# Patient Record
Sex: Male | Born: 1960 | Race: White | Hispanic: No
Health system: Midwestern US, Community
[De-identification: ages and names within clinical notes are randomized; demographics above are authoritative.]

## PROBLEM LIST (undated history)

## (undated) DIAGNOSIS — I10 Essential (primary) hypertension: Secondary | ICD-10-CM

## (undated) DIAGNOSIS — N289 Disorder of kidney and ureter, unspecified: Secondary | ICD-10-CM

## (undated) DIAGNOSIS — N200 Calculus of kidney: Secondary | ICD-10-CM

## (undated) HISTORY — PX: NEPHRECTOMY RADICAL: SUR878

## (undated) HISTORY — PX: NEPHRECTOMY: SHX65

## (undated) HISTORY — PX: KIDNEY CYST REMOVAL: SHX684

---

## 2012-01-10 LAB — COMPREHENSIVE PANEL
ALT: 22 IU/L (ref 10–40)
AST: 17 IU/L (ref 8–41)
Albumin: 4.3 G/DL (ref 3.4–4.8)
Alkaline Phosphatase: 73 IU/L (ref 18–125)
Anion Gap: 11 MEQ/L (ref 7–19)
BUN: 14 MG/DL (ref 6–20)
CO2: 26 MEQ/L (ref 23–29)
Calcium: 9.1 MG/DL (ref 8.5–10.3)
Chloride: 102 MEQ/L (ref 98–111)
Creatinine: 1.1 MG/DL (ref 0.3–1.3)
Gfr Calculated: 76 mL/min (ref 59–300)
Glucose: 106 MG/DL
Potassium: 3.8 mEq/L (ref 3.5–5.0)
Sodium: 139 MEQ/L (ref 136–145)
Total Bilirubin: 0.5 MG/DL (ref 0.2–1.2)
Total Protein: 6.7 G/DL (ref 6.4–8.3)

## 2012-01-10 LAB — CBC PANEL ITEM
Basophils %: 0.4 % (ref 0–1)
Basophils: 0.03 10*3/uL (ref 0.0–0.20)
Eosinophils %: 0.38 10*3/uL (ref 0.0–0.60)
Eosinophils %: 4.6 % (ref 1–5)
Hematocrit: 44.3 % (ref 42–52)
Hemoglobin: 15.7 G/DL (ref 14.0–18.0)
Lymphocytes: 2.73 10*3/uL (ref 1.1–4.5)
Lymphocytes: 32.7 % (ref 20–40)
MCH: 31.7 PG — ABNORMAL HIGH (ref 27–31)
MCHC: 35.4 G/DL (ref 33–37)
MCV: 89.3 FL (ref 80–94)
MPV: 11.1 FL (ref 10.2–13.2)
Monocytes %: 6.9 % (ref 0–10)
Monocytes: 0.58 10*3/uL (ref 0.0–0.9)
Neutrophils %: 55.4 % (ref 50–70)
Neutrophils Absolute: 4.63 10*3/uL (ref 1.5–7.5)
Platelets: 153 10*3/uL (ref 130–400)
RBC: 4.96 MIL/uL (ref 4.7–6.1)
RDW: 13.9 % (ref 11.0–14.0)
WBC: 8.35 10*3/uL (ref 4.8–10.8)

## 2012-01-11 LAB — URINALYSIS
Bilirubin Urine: NEGATIVE
Glucose, Ur: NEGATIVE MG/DL
Ketones, Urine: NEGATIVE MG/DL
Leukocyte Esterase, Urine: NEGATIVE
Nitrite, Urine: NEGATIVE
Protein, UA: NEGATIVE MG/DL
Specific Gravity, Urine: 1.02 (ref 1.001–1.035)
Urobilinogen, Urine: 1 EU/DL
pH, UA: 5.5

## 2015-12-13 ENCOUNTER — Emergency Department: Admit: 2015-12-13 | Payer: Medicaid Other

## 2015-12-13 ENCOUNTER — Inpatient Hospital Stay: Admit: 2015-12-13 | Discharge: 2015-12-13 | Disposition: A | Discharge status: Home / Self Care | Payer: Medicaid Other

## 2015-12-13 DIAGNOSIS — N200 Calculus of kidney: Secondary | ICD-10-CM

## 2015-12-13 LAB — CBC WITH AUTO DIFFERENTIAL
Basophils %: 0.5 % (ref 0.0–1.0)
Basophils Absolute: 0 10*3/uL (ref 0.00–0.20)
Eosinophils %: 3.4 % (ref 0.0–5.0)
Eosinophils Absolute: 0.2 10*3/uL (ref 0.00–0.60)
Hematocrit: 44.6 % (ref 42.0–52.0)
Hemoglobin: 15.3 g/dL (ref 14.0–18.0)
Lymphocytes %: 37.1 % (ref 20.0–40.0)
Lymphocytes Absolute: 2.4 10*3/uL (ref 1.1–4.5)
MCH: 31.7 pg — ABNORMAL HIGH (ref 27.0–31.0)
MCHC: 34.3 g/dL (ref 33.0–37.0)
MCV: 92.3 fL (ref 80.0–94.0)
MPV: 10.9 fL (ref 9.4–12.4)
Monocytes %: 8 % (ref 0.0–10.0)
Monocytes Absolute: 0.5 10*3/uL (ref 0.00–0.90)
Neutrophils %: 50.8 % (ref 50.0–65.0)
Neutrophils Absolute: 3.3 10*3/uL (ref 1.5–7.5)
Platelets: 135 10*3/uL (ref 130–400)
RBC: 4.83 M/uL (ref 4.70–6.10)
RDW: 13.2 % (ref 11.5–14.5)
WBC: 6.4 10*3/uL (ref 4.8–10.8)

## 2015-12-13 LAB — COMPREHENSIVE METABOLIC PANEL
ALT: 19 U/L (ref 5–41)
AST: 19 U/L (ref 5–40)
Albumin: 4.4 g/dL (ref 3.5–5.2)
Alkaline Phosphatase: 63 U/L (ref 40–130)
Anion Gap: 11 mmol/L (ref 7–19)
BUN: 11 mg/dL (ref 6–20)
CO2: 24 mmol/L (ref 22–29)
Calcium: 9.1 mg/dL (ref 8.6–10.0)
Chloride: 104 mmol/L (ref 98–111)
Creatinine: 1 mg/dL (ref 0.5–1.2)
GFR Non-African American: >60 (ref >60)
Glucose: 80 mg/dL (ref 74–109)
Potassium: 4 mmol/L (ref 3.5–5.0)
Sodium: 139 mmol/L (ref 136–145)
Total Bilirubin: 0.8 mg/dL (ref 0.2–1.2)
Total Protein: 7.1 g/dL (ref 6.6–8.7)

## 2015-12-13 LAB — URINALYSIS
Bilirubin Urine: NEGATIVE
Blood, Urine: NEGATIVE
Glucose, Ur: NEGATIVE mg/dL
Ketones, Urine: NEGATIVE mg/dL
Leukocyte Esterase, Urine: NEGATIVE
Nitrite, Urine: NEGATIVE
Protein, UA: NEGATIVE mg/dL
Specific Gravity, UA: 1.02 (ref 1.005–1.030)
Urobilinogen, Urine: 1 E.U./dL (ref <2.0)
pH, UA: 6 (ref 5.0–8.0)

## 2015-12-13 MED ORDER — HYDROMORPHONE HCL 1 MG/ML IJ SOLN
1 MG/ML | Freq: Once | INTRAMUSCULAR | Status: AC
Start: 2015-12-13 — End: 2015-12-13
  Administered 2015-12-13: 16:00:00 1 mg via INTRAVENOUS

## 2015-12-13 MED ORDER — ONDANSETRON HCL 4 MG/2ML IJ SOLN
4 MG/2ML | Freq: Once | INTRAMUSCULAR | Status: AC
Start: 2015-12-13 — End: 2015-12-13
  Administered 2015-12-13: 15:00:00 4 mg via INTRAVENOUS

## 2015-12-13 MED ORDER — SODIUM CHLORIDE 0.9 % IV BOLUS
0.9 % | Freq: Once | INTRAVENOUS | Status: AC
Start: 2015-12-13 — End: 2015-12-13
  Administered 2015-12-13: 15:00:00 500 mL via INTRAVENOUS

## 2015-12-13 MED ORDER — TAMSULOSIN HCL 0.4 MG PO CAPS
0.4 MG | Freq: Once | ORAL | Status: AC
Start: 2015-12-13 — End: 2015-12-13
  Administered 2015-12-13: 15:00:00 0.4 mg via ORAL

## 2015-12-13 MED ORDER — PROMETHAZINE HCL 25 MG PO TABS
25 MG | ORAL_TABLET | Freq: Four times a day (QID) | ORAL | 0 refills | Status: AC | PRN
Start: 2015-12-13 — End: ?

## 2015-12-13 MED ORDER — MORPHINE SULFATE (PF) 4 MG/ML IV SOLN
4 MG/ML | Freq: Once | INTRAVENOUS | Status: AC
Start: 2015-12-13 — End: 2015-12-13
  Administered 2015-12-13: 15:00:00 4 mg via INTRAVENOUS

## 2015-12-13 MED ORDER — ONDANSETRON HCL 4 MG/2ML IJ SOLN
4 MG/2ML | Freq: Once | INTRAMUSCULAR | Status: AC
Start: 2015-12-13 — End: 2015-12-13
  Administered 2015-12-13: 16:00:00 4 mg via INTRAVENOUS

## 2015-12-13 MED FILL — HYDROMORPHONE HCL 1 MG/ML IJ SOLN: 1 MG/ML | INTRAMUSCULAR | Qty: 1

## 2015-12-13 MED FILL — TAMSULOSIN HCL 0.4 MG PO CAPS: 0.4 MG | ORAL | Qty: 1

## 2015-12-13 MED FILL — ONDANSETRON HCL 4 MG/2ML IJ SOLN: 4 MG/2ML | INTRAMUSCULAR | Qty: 2

## 2015-12-13 MED FILL — MORPHINE SULFATE (PF) 4 MG/ML IV SOLN: 4 MG/ML | INTRAVENOUS | Qty: 1

## 2015-12-13 NOTE — ED Provider Notes (Signed)
[facility] EMER[facility] DEPT  eMERGENCY dEPARTMENT eNCOUnter      Pt Name: Christopher Peters  MRN: [id number]  Birthdate Dec 20, 1960  Date of evaluation: 12/13/2015  Provider: Barbaraann Barthel, PA    CHIEF COMPLAINT       Chief Complaint   Patient presents with   . Flank Pain     left flank pain onset last pm hx kidney stones         HISTORY OF PRESENT ILLNESS  (Location/Symptom, Timing/Onset, Context/Setting, Quality, Duration, Modifying Factors, Severity.)   Christopher Peters is a 55 y.o. male who presents to the emergency department with left flank pain that began last night. States that he has a history of kidney stones in which he has had to have lithotripsy performed in the past due to inability to pass on his own. Has noted pain with urination since the symptoms began. Sees a urologist in [place] in which he is currently in town working over the next week. States that his pain is currently a 10/10 in which he has taken his prescribed pain medication without relief of his symptoms.     HPI    Nursing Notes were reviewed and I agree.    REVIEW OF SYSTEMS    (2-9 systems for level 4, 10 or more for level 5)     Review of Systems   Constitutional: Negative for chills and fever.   Respiratory: Negative for cough, shortness of breath and wheezing.    Cardiovascular: Negative for chest pain and leg swelling.   Gastrointestinal: Negative for abdominal pain, constipation, diarrhea, nausea and vomiting.   Genitourinary: Positive for difficulty urinating and flank pain. Negative for dysuria, frequency and hematuria.   Musculoskeletal: Negative for back pain, myalgias, neck pain and neck stiffness.   Neurological: Negative for dizziness, weakness, light-headedness, numbness and headaches.   All other systems reviewed and are negative.       Except as noted above the remainder of the review of systems was reviewed and negative.       PAST MEDICAL HISTORY     Past Medical History:   Diagnosis Date   . Hypertension    . Kidney  stone          SURGICAL HISTORY       Past Surgical History:   Procedure Laterality Date   . FRACTURE SURGERY           CURRENT MEDICATIONS       Discharge Medication List as of 12/13/2015 12:45 PM      CONTINUE these medications which have NOT CHANGED    Details   atenolol (TENORMIN) 50 MG tablet Take 50 mg by mouth dailyHistorical Med      Cholecalciferol (VITAMIN D3) 2000 units CAPS Take by mouthHistorical Med             ALLERGIES     Codeine    FAMILY HISTORY     History reviewed. No pertinent family history.       SOCIAL HISTORY       Social History     Social History   . Marital status: Legally Separated     Spouse name: N/A   . Number of children: N/A   . Years of education: N/A     Social History Main Topics   . Smoking status: Current Every Day Smoker   . Smokeless tobacco: None   . Alcohol use No   . Drug use: No   . Sexual activity:  Not Asked     Other Topics Concern   . None     Social History Narrative   . None       SCREENINGS    Glasgow Coma Scale  Eye Opening: Spontaneous  Best Verbal Response: Oriented  Best Motor Response: Obeys commands  Glasgow Coma Scale Score: 15      PHYSICAL EXAM    (up to 7 for level 4, 8 or more for level 5)   ED Triage Vitals   BP Temp Temp Source Pulse Resp SpO2 Height Weight   12/13/15 1157 12/13/15 1050 12/13/15 1157 12/13/15 1050 12/13/15 1050 12/13/15 1050 12/13/15 1050 12/13/15 1050   118/91 97.4 F (36.3 C) Temporal 59 20 97 %  (1.753 m) 260 lb (117.9 kg)       Physical Exam   Constitutional: He is oriented to person, place, and time. He appears well-developed and well-nourished.   Neck: Normal range of motion and full passive range of motion without pain. Neck supple.   Cardiovascular: Normal rate, regular rhythm, normal heart sounds and intact distal pulses.    Pulmonary/Chest: Effort normal and breath sounds normal. No respiratory distress.   Abdominal: Soft. Bowel sounds are normal. There is no tenderness.   Mild L flank TTP. No evidence of guarding  appreciated.    Musculoskeletal: Normal range of motion.   Neurological: He is alert and oriented to person, place, and time.   Skin: Skin is warm.   Nursing note and vitals reviewed.        DIAGNOSTIC RESULTS     RADIOLOGY:   Non-plain film images such as CT, Ultrasound and MRI are read by the radiologist. Plain radiographic images are visualized and preliminarily interpreted by No att. providers found with the below findings:    Interpretation per the Radiologist below, if available at the time of this note:    CT Kidney WO Contrast   Final Result   1. Multiple large stones in the left kidney without evidence of   hydronephrosis. 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    2. Tiny stones are present in the right kidney without evidence of   obstruction.   Signed by Dr Reuel Boom Riherd on 12/13/2015 11:57 AM          LABS:  Labs Reviewed   CBC WITH AUTO DIFFERENTIAL - Abnormal; Notable for the following:        Result Value    MCH 31.7 (*)     All other components within normal limits   COMPREHENSIVE METABOLIC PANEL   URINALYSIS       All other labs were within normal range or not returned as of this dictation.      EMERGENCY DEPARTMENT COURSE and DIFFERENTIAL DIAGNOSIS/MDM:   Vitals:    Vitals:    12/13/15 1050 12/13/15 1157 12/13/15 1315   BP:  (!) 118/91 122/77   Pulse: 59 58 52   Resp: Temp: 97.4 F (36.3 C) 97.1 F (36.2 C) 96.9 F (36.1 C)   TempSrc:  Temporal Oral   SpO2: 97% 98%    Weight: 260 lb (117.9 kg)     Height:  (1.753 m)           MDM  Number of Diagnoses or Management Options  Nephrolithiasis:   Diagnosis management comments: Advised to f/u with urology if symptoms return. States that his symptoms have improved while in the  ED. At this time, the pt is stable to be discharged home.       PROCEDURES:    Procedures      FINAL IMPRESSION      1. Nephrolithiasis          DISPOSITION/PLAN   DISPOSITION Decision to Discharge     Pt was told that if symptoms worsen or  new symptoms develop they're to return to their PCP or the emergency department.  Patient to follow up w/ their PCP. Patient  was educated on diagnosis and treatment plan.  All of patient's questions were answered, and the patient understands the discharge plan. I do not feel the patient has a life-threatening condition at this time.  Patient is stable to be discharged.      PATIENT REFERRED TO:  [facility]  [address]  [place] [address]  [phone]  Schedule an appointment as soon as possible for a visit  As needed    [facility]  EMERGENCY DEPT  [address]  [place] [address]  [phone]  Go to  If symptoms worsen      DISCHARGE MEDICATIONS:  Discharge Medication List as of 12/13/2015 12:45 PM      START taking these medications    Details   promethazine (PHENERGAN) 25 MG tablet Take 1 tablet by mouth every 6 hours as needed for Nausea, Disp-15 tablet, R-0Print             (Please note that portions of this note were completed with a voice recognition program.  Efforts were made to edit the dictations but occasionally words are mis-transcribed.)    Barbaraann Barthel, PA       [place], [place]  12/15/15 1742

## 2015-12-13 NOTE — Discharge Instructions (Signed)
 Kidney Stone: Care Instructions  Your Care Instructions    Kidney stones are formed when salts, minerals, and other substances normally found in the urine clump together. They can be as small as grains of sand or, rarely, as large as golf balls.  While the stone is traveling through the ureter, which is the tube that carries urine from the kidney to the bladder, you will probably feel pain. The pain may be mild or very severe. You may also have some blood in your urine. As soon as the stone reaches the bladder, any intense pain should go away.  If a stone is too large to pass on its own, you may need a medical procedure to help you pass the stone.  The doctor has checked you carefully, but problems can develop later. If you notice any problems or new symptoms, get medical treatment right away.  Follow-up care is a key part of your treatment and safety. Be sure to make and go to all appointments, and call your doctor if you are having problems. It's also a good idea to know your test results and keep a list of the medicines you take.  How can you care for yourself at home?   Drink plenty of fluids, enough so that your urine is light yellow or clear like water. If you have kidney, heart, or liver disease and have to limit fluids, talk with your doctor before you increase the amount of fluids you drink.   Take pain medicines exactly as directed. Call your doctor if you think you are having a problem with your medicine.   If the doctor gave you a prescription medicine for pain, take it as prescribed.   If you are not taking a prescription pain medicine, ask your doctor if you can take an over-the-counter medicine. Read and follow all instructions on the label.   Your doctor may ask you to strain your urine so that you can collect your kidney stone when it passes. You can use a kitchen strainer or a tea strainer to catch the stone. Store it in a plastic bag until you see your doctor again.  Preventing future  kidney stones  Some changes in your diet may help prevent kidney stones. Depending on the cause of your stones, your doctor may recommend that you:   Drink plenty of fluids, enough so that your urine is light yellow or clear like water. If you have kidney, heart, or liver disease and have to limit fluids, talk with your doctor before you increase the amount of fluids you drink.   Limit coffee, tea, and alcohol. Also avoid grapefruit juice.   Do not take more than the recommended daily dose of vitamins C and D.   Avoid antacids such as Gaviscon, Maalox, Mylanta, or Tums.   Limit the amount of salt (sodium) in your diet.   Eat a balanced diet that is not too high in protein.   Limit foods that are high in a substance called oxalate, which can cause kidney stones. These foods include dark green vegetables, rhubarb, chocolate, wheat bran, nuts, cranberries, and beans.  When should you call for help?  Call your doctor now or seek immediate medical care if:   You cannot keep down fluids.   Your pain gets worse.   You have a fever or chills.   You have new or worse pain in your back just below your rib cage (the flank area).   You have new or more  blood in your urine.  Watch closely for changes in your health, and be sure to contact your doctor if:   You do not get better as expected.  Where can you learn more?  Go to [email or web address] and sign in to your MyChart account. Enter [phone] in the Search Health Information box to learn more about "Kidney Stone: Care Instructions."     If you do not have an account, please click on the "Sign Up Now" link.  Current as of: April 11, 2014  Content Version: 11.2   2006-2017 [employer]. Care instructions adapted under license by [facility]. If you have questions about a medical condition or this instruction, always ask your healthcare professional. [employer] disclaims any warranty or liability for your use of this  information.

## 2015-12-21 NOTE — ED Provider Notes (Signed)
Formatting of this note is different from the original.  Subjective   HPI Comments: C/o left flank pain x 1 day.  Has a history of kidney stones and renal failure.  He states he has 17% function of the left kidney.  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  He is here trying to find work.    Patient is a 55 y.o. male presenting with flank pain.   History provided by:  Patient  Language interpreter used: No    Flank Pain   Pain location:  L flank  Pain quality: pressure, sharp and stabbing    Pain quality: not aching, not bloating, not burning, not cramping, not dull, no fullness, not gnawing, not heavy, not shooting, not squeezing, no stiffness, not tearing, not throbbing and not tugging    Pain radiates to:  Does not radiate  Pain severity:  Moderate  Onset quality:  Sudden  Duration:  1 day  Timing:  Intermittent  Progression:  Worsening  Chronicity:  Recurrent  Context: not alcohol use, not awakening from sleep, not diet changes, not eating, not laxative use, not medication withdrawal, not previous surgeries, not recent illness, not recent sexual activity, not recent travel, not retching, not sick contacts, not suspicious food intake and not trauma    Relieved by:  None tried  Worsened by:  Nothing  Ineffective treatments:  None tried  Associated symptoms: no anorexia, no belching, no chest pain, no chills, no constipation, no cough, no diarrhea, no dysuria, no fatigue, no fever, no flatus, no hematemesis, no hematochezia, no hematuria, no melena, no nausea, no shortness of breath, no sore throat, no vaginal bleeding, no vaginal discharge and no vomiting      Review of Systems   Constitutional: Negative for activity change, appetite change, chills, fatigue and fever.   HENT: Negative for congestion, ear pain, facial swelling and sore throat.    Eyes: Negative for discharge and visual disturbance.   Respiratory: Negative for apnea, cough, chest tightness, shortness of breath, wheezing and stridor.     Cardiovascular: Negative for chest pain and palpitations.   Gastrointestinal: Negative for abdominal distention, abdominal pain, anorexia, constipation, diarrhea, flatus, hematemesis, hematochezia, melena, nausea and vomiting.   Genitourinary: Positive for flank pain. Negative for difficulty urinating, dysuria, hematuria, vaginal bleeding and vaginal discharge.   Musculoskeletal: Negative for arthralgias and myalgias.   Skin: Negative for rash and wound.   Neurological: Negative for dizziness and seizures.   Psychiatric/Behavioral: Negative for agitation and confusion.     Past Medical History:   Diagnosis Date   ? Hypertension    ? Kidney failure      Allergies   Allergen Reactions   ? Tylenol With Codeine #3 [Acetaminophen-Codeine]      History reviewed. No pertinent surgical history.    History reviewed. No pertinent family history.    Social History     Social History   ? Marital status: Married     Spouse name: N/A   ? Number of children: N/A   ? Years of education: N/A     Social History Main Topics   ? Smoking status: Current Every Day Smoker     Packs/day: 1.00   ? Smokeless tobacco: None   ? Alcohol use No   ? Drug use: No   ? Sexual activity: Not Asked     Other Topics Concern   ? None     Social History Narrative   ? None  Objective   Physical Exam   Constitutional: He is oriented to person, place, and time. He appears well-developed.   HENT:   Head: Normocephalic.   Eyes: EOM are normal. Pupils are equal, round, and reactive to light.   Neck: Normal range of motion. Neck supple.   Cardiovascular: Normal rate and regular rhythm.    No murmur heard.  Pulmonary/Chest: Effort normal and breath sounds normal.   Abdominal: Soft. Bowel sounds are normal.   Musculoskeletal: Normal range of motion.   Neurological: He is alert and oriented to person, place, and time.   Skin: Skin is warm and dry.   Psychiatric: He has a normal mood and affect.   Nursing note and vitals reviewed.    Procedures        ED  Course  ED Course   Comment By Time   Transfer of care to [facility], APRN 07/31 2055       I have reassessed the patient.  He reports feeling better after the pain medication. He has been reexamined. He has pain to palpation on his lower spinous process and left lower paralumbar region.   He does not present with the rash. I have updated the patient on all test results. He is visiting for work and plans to go home in 2-3 weeks. I have reviewed the differentials, I.e. Back pain/strain, herpes zoster, flank pain, etc...    Patient has been advised to followup with his PCP. He is welcome to return to the ED for any acute worsening s/sx.         MDM    Final diagnoses:   Left-sided back pain, unspecified back location, unspecified chronicity       [address] [place], [place]  12/21/15 2132    Electronically signed by Drue Stager, MD at 12/21/2015 11:07 PM EDT    Associated attestation - Drue Stager, MD - 12/21/2015 11:07 PM EDT  Formatting of this note might be different from the original.      For this patient encounter, I reviewed the NP or PA documentation, treatment plan, and medical decision making. Drue Stager, MD 12/21/2015 10:07 PM

## 2015-12-21 NOTE — ED Notes (Signed)
Formatting of this note might be different from the original.  Patient discharged home  with family at side ambulatory to personal car. No distress noted. Personal belongings with patient. Patient/family voice understanding to instructions given. Pt denies any pain.      Heidi Dach, RN  12/21/15 2144    Electronically signed by Heidi Dach, RN at 12/21/2015 10:44 PM EDT

## 2017-01-18 NOTE — ED Notes (Signed)
Formatting of this note might be different from the original.  LWOT @1942PM     Ulla PotashHeather N Johnson, RN  01/18/17 2028    Electronically signed by Ulla PotashHeather N Johnson, RN at 01/18/2017  8:28 PM EDT

## 2017-01-18 NOTE — ED Triage Notes (Signed)
Formatting of this note might be different from the original.  Pt arrives with c/o left flank pain that radiates to left abdomen. Pt reports he had his left kidney removed in April due to excessive kidney stones. Pt reports he has been having problems since the surgery. Pt is from [place] on vacation. Urologist is in [place].   Electronically signed by Wendie SimmerElizabeth M Townsend, RN at 01/18/2017  6:27 PM EDT

## 2017-06-15 NOTE — ED Provider Notes (Signed)
Formatting of this note is different from the original.  Images from the original note were not included.    History     Chief Complaint   Patient presents with   ? Shortness of Breath     Pt c/o worsening shortness of breath with substernal chest pressure x 4-5 days.  Pt reports being seen and dx with bronchitis, taking abx as prescribed "and mucinex and cough pearls".  Pt denies any dizziness, c/o some intermittent nausea.      History provided by:  Patient  Language interpreter used: No    Chest Pain   Pain location:  Substernal area  Pain quality: pressure    Pain radiates to:  Does not radiate  Pain severity:  Moderate  Onset quality:  Gradual  Duration:  1 day  Timing:  Constant  Progression:  Unchanged  Chronicity:  New  Context: at rest    Context: not breathing, not drug use, not eating, not intercourse, not lifting, not movement, not raising an arm, not stress and not trauma    Relieved by:  Nothing  Worsened by:  Deep breathing, coughing and exertion  Ineffective treatments:  Rest (inhaler, tessalon)  Associated symptoms: cough, fatigue, nausea and shortness of breath    Associated symptoms: no abdominal pain, no AICD problem, no altered mental status, no anorexia, no anxiety, no back pain, no claudication, no diaphoresis, no dizziness, no dysphagia, no fever, no headache, no heartburn, no lower extremity edema, no near-syncope, no numbness, no orthopnea, no palpitations, no PND, no syncope, no vomiting and no weakness    Risk factors: hypertension, male sex, obesity and smoking    Risk factors: no aortic disease, no birth control, no coronary artery disease, no diabetes mellitus, no high cholesterol, no immobilization, no prior DVT/PE and no surgery    URI   Presenting symptoms: congestion, cough, fatigue and rhinorrhea    Presenting symptoms: no ear pain, no facial pain, no fever and no sore throat    Severity:  Moderate  Onset quality:  Gradual  Duration:  4 days  Timing:  Constant  Progression:   Worsening  Chronicity:  New  Relieved by:  Nothing  Worsened by:  Nothing  Ineffective treatments:  Inhaler and prescription medications  Associated symptoms: no arthralgias, no headaches, no myalgias, no neck pain, no sinus pain, no sneezing, no swollen glands and no wheezing    Risk factors: not elderly, no chronic cardiac disease, no chronic kidney disease, no chronic respiratory disease, no diabetes mellitus, no immunosuppression, no recent illness, no recent travel and no sick contacts      No LMP for male patient.            Allergies   Allergen Reactions   ? Tylenol-Codeine [Acetaminophen-Codeine]      "made my skin crawl"     Past Medical History:   Diagnosis Date   ? Anxiety    ? Hypertension    ? Neuromyopathy (HCC)    ? Obesity    ? Renal disorder      Past Surgical History:   Procedure Laterality Date   ? HEART CATHETERIZATION     ? NEPHRECTOMY      LEFT     No family history on file.    Social History     Tobacco Use   ? Smoking status: Current Every Day Smoker     Packs/day: 1.00     Types: Cigarettes   ? Smokeless tobacco: Never Used  Substance Use Topics   ? Alcohol use: No   ? Drug use: No     Review of Systems   Constitutional: Positive for appetite change and fatigue. Negative for chills, diaphoresis, fever and unexpected weight change.   HENT: Positive for congestion, postnasal drip and rhinorrhea. Negative for ear discharge, ear pain, sinus pressure, sinus pain, sneezing, sore throat, tinnitus and trouble swallowing.    Eyes: Negative for pain, discharge, redness and visual disturbance.   Respiratory: Positive for cough, chest tightness and shortness of breath. Negative for choking and wheezing.    Cardiovascular: Positive for chest pain. Negative for palpitations, orthopnea, claudication, leg swelling, syncope, PND and near-syncope.   Gastrointestinal: Positive for nausea. Negative for abdominal distention, abdominal pain, anorexia, constipation, diarrhea, heartburn and vomiting.    Genitourinary: Negative for decreased urine volume, difficulty urinating, dysuria, flank pain, frequency and urgency.   Musculoskeletal: Negative for arthralgias, back pain, myalgias and neck pain.   Skin: Negative for pallor and rash.   Allergic/Immunologic: Negative for immunocompromised state.   Neurological: Negative for dizziness, syncope, weakness, light-headedness, numbness and headaches.   Hematological: Negative for adenopathy.   Psychiatric/Behavioral: Negative for confusion.   All other systems reviewed and are negative.        Physical Exam     Vitals Recorded in This Encounter       06/15/2017 1216 06/15/2017 1633        BP:  147/105  (Abnormal)   156/96  (Abnormal)       Pulse:  64  52      Resp:  18  --      Temp:  36.4 C (97.5 F)  --      Temp src:  Oral  --      SpO2:  98 %  95 %      Weight:  128.8 kg (284 lb)  (Abnormal)   --      Height:  1.753 m (5\' 9" )  --         Physical Exam   Constitutional: He is oriented to person, place, and time. He appears well-developed and well-nourished. No distress.   HENT:   Head: Normocephalic and atraumatic.   Right Ear: Tympanic membrane normal.   Left Ear: Tympanic membrane normal.   Nose: Mucosal edema and rhinorrhea present. Right sinus exhibits no maxillary sinus tenderness and no frontal sinus tenderness. Left sinus exhibits no maxillary sinus tenderness and no frontal sinus tenderness.   Mouth/Throat: Uvula is midline and mucous membranes are normal. Posterior oropharyngeal erythema present. No oropharyngeal exudate or posterior oropharyngeal edema. No tonsillar exudate.   Eyes: Conjunctivae and EOM are normal. Pupils are equal, round, and reactive to light. Right eye exhibits no discharge. Left eye exhibits no discharge. No scleral icterus.   Neck: Normal range of motion. Neck supple. No JVD present.   Cardiovascular: Normal rate, regular rhythm, normal heart sounds and intact distal pulses. Exam reveals no gallop and no friction rub.   No murmur  heard.  Pulmonary/Chest: Effort normal and breath sounds normal. No respiratory distress. He has no wheezes. He has no rales.   Abdominal: Soft. Bowel sounds are normal. He exhibits no distension and no mass. There is no tenderness. There is no rebound and no guarding.   Musculoskeletal: Normal range of motion. He exhibits no edema.   Lymphadenopathy:     He has no cervical adenopathy.   Neurological: He is alert and oriented to person, place, and time. He  has normal strength.   Skin: Skin is warm and dry. Capillary refill takes less than 2 seconds. No rash noted. He is not diaphoretic. No erythema. No pallor.   Psychiatric: He has a normal mood and affect. His behavior is normal. Thought content normal.   Nursing note and vitals reviewed.    ED Course       Results for orders placed or performed during the hospital encounter of 06/15/17   CBC and differential   Result Value Ref Range    WBC 6.8 3.7 - 10.3 X THOU    RBC 4.77 3.98 - 5.84 X MIL    Hemoglobin 15.2 13.3 - 17.1 g/dL    Hematocrit 45 38 - 49 %    MCV 94 81 - 98 fl    MCH 31.9 27.4 - 33.7 pG    MCHC 33.8 32.8 - 35.8 g/dL    Platelet Count 161 (L) 144 - 402 x THOU    RDW 13.1 12.0 - 16.3    MPV 9.6 7.5 - 11.2 fL    Platelets 111 (L) 144 - 402 x THOU    Segs 66 44 - 77 %    Bands Relative NONE 0 - 10 %    Lymphs 24 14 - 56 %    Monocytes 6 4 - 14 %    Eosinophil 3 0 - 10 %    Basophils % 1 0 - 3 %    Metamyelocytes Relative NONE 0 %    Myelocyte NONE 0 %    Promyelocyte NONE 0 %    Blasts NONE 0 %    Neutrophils Absolute 4.5 1.5 - 7.7 x THOU/uL    Lymphocytes Absolute 1.7 0.9 - 3.5 x Thou/uL    Monocytes Absolute 0.4 0.3 - 1.0 x Thou/uL    Eosinophils Absolute 0.2 0.0 - 0.7 x Thou/uL    Basophils Absolute 0.1 0.0 - 0.2 x Thou/uL    Nucleated RBC's NONE 0 /100 WBC    Platelet Estimate NOT PERFORMED /OIF    Morphology Comments NOT PERFORMED    Comprehensive metabolic panel   Result Value Ref Range    Sodium, Serum 139 136 - 145 mmol/L    Potassium 3.8 3.5 -  5.1 mmol/L    Chloride 104 98 - 107 mmol/L    CO2 28 21 - 32 mmol/L    Glucose 97 74 - 106 mg/dL    BUN, Serum 16 7 - 18 mg/dL    Creatinine, Ser 1.4 (H) 0.6 - 1.3 mg/dL    Total Protein 6.7 6.4 - 8.3 g/dL    Albumin 3.8 3.4 - 5.0 g/dL    Calcium 8.2 (L) 8.5 - 10.1 mg/dL    AST 30 7 - 35 U/L    Alkaline Phosphatase 71 45 - 117 U/L    Total Bilirubin 1.2 (H) 0.2 - 1.0 mg/dL    ALT 30 12 - 78 U/L    A/G Ratio 1.3 1.0 - 2.2 RATIO    Osmolality Calc 279 275 - 300 mOsmol/kg    Anion Gap 7 5.0 - 14.0 mmol/L    GFR MDRD Non Af Amer 56 (L) >59 mL/min/1.4m    GFR MDRD Af Amer >60 >59 mL/min/1.30m    GFR Comments SEE NOTES    Troponin I   Result Value Ref Range    Troponin I <0.015 <0.045 ng/mL   NT-ProBNP   Result Value Ref Range    NT-ProBNP (B-Type Natriuretic Peptide) 270 <300  pg/mL   Urinalysis with reflex microscopy, reflex culture and culture screen   Result Value Ref Range    Appearance CLEAR     Color, UA YELLOW     Specific Gravity, UA 1.020 1.001 - 1.035    pH, UA 6.0 5 - 8    Protein, UA 30 (A) N^NEGATIVE mg/dL    Glucose, UA NEGATIVE N^NEGATIVE mg/dL    Ketones, UA TRACE (A) N^NEGATIVE MG/DL    Bilirubin, UA NEGATIVE N^NEGATIVE    Blood, UA NEGATIVE N^NEGATIVE    Nitrite, UA NEGATIVE N^NEGATIVE    Urobilinogen, UA 4.0 (H) 0.2 - 1.0 EU/DL    Leukocytes, UA NEGATIVE N^NEGATIVE    Urine Culture screen URINE CULTURE SCREEN NEGATIVE.    Urine Microscopy   Result Value Ref Range    WBC, UA 1 TO 5 0 - 5 /HPF    RBC, UA NONE 0 - 2 /HPF    Bacteria, Urine 4+ (A) NONE^NONE /HPF    Epithelial Cells 1+ (A) OCC^OCC /LPF    Epithelial Cells SQUAMOUS (A) OCC^OCC /LPF    Urine Micro Comments SEE NOTES NONE^NONE /HPF   Troponin I   Result Value Ref Range    Troponin I <0.015 <0.045 ng/mL             Imaging Results       XR PORTABLE CHEST AP (Final result)  Result time 06/15/17 12:55:59    Final result by Lovett Sox. Aundria Rud, MD (06/15/17 12:55:59)            Impression:    XR CHEST AP PORTABLE  ORDERING PRIORITY   :STAT  DATE/TIME  PERFORMED :06/15/2017 12:37  DICTATING RADIOLOGIST:ROGERS, STEVEN  CLINICAL INDICATIONS:chest pain  ORDERING PHYSICIAN  :Starr Sinclair    Study:  XR CHEST AP PORTABLE    Reason for Exam:  chest pain    Comparison:  None    Findings:  Cardiac monitor leads seen overlying the chest. Heart size is normal. Mild chronic increased bronchial interstitial markings without localized infiltrate. No pleural fluid or pneumothorax seen. Osseous structures are intact.    Impression:  Chronic findings as above no acute findings in the chest.    Electronically signed by: Clayborne Artist, M.D. 06/15/2017 12:55 PM                   EKG:    Vent. Rate : 064 BPM   Atrial Rate : 064 BPM   P-R Int : 168 ms     QRS Dur : 104 ms   QT Int : 400 ms    P-R-T Axes : 068 -04 040 degrees   QTc Int : 412 ms    Normal sinus rhythm  Incomplete right bundle branch block  Nonspecific T wave abnormality    Procedures    MDM  Number of Diagnoses or Management Options  Acute viral bronchitis: new and requires workup  Diagnosis management comments: Patient evaluated for complaint of substernal chest pressure and tightness that began last night along with worsening shortness of breath at rest. Patient has been having URI symptoms, was seen at another facility, diagnosed with bronchitis and was prescribed albuterol inhaler and tessalon. He has been taking Mucinex as well. He got seen at a clinic and was given doxycycline. Patient states cough was productive but is not dry and he has SOB at rest and substernal chest pressure. He coughs if he tries to take a deep breath but denies pain with inspiration.  He reports nausea, denies diarrhea or constipation. He is afebrile, is not tachycardic, appears uncomfortable but not toxic. Physical exam mostly unremarkable. Symptoms and presentation likely due continuing viral bronchitis but patient is a 57 year old male, hx HTN, 1 ppd smoker x 40 years. Will r/o cardiac cause. EKG normal sinus rhythm,  incomplete RBBB, nonspecific T wave abnormality. No ST segment abnormality. Chest x-ray negative for effusion or infiltrate, +increased bronchial interstitial markings. BNP negative, troponin negative x 2. Patient treated with IVF and dexamethasone. Prescribed antihistamine, intranasal steroid, refilled tessalon. Advised to continue inhaler, rest, oral hydration. ED return precautions given, otherwise referred to PCP.    Patient Progress  Patient progress: stable    Patient evaluated in ED and specified complaints managed; any additional findings addressed accordingly in the appropriate context of such discovery throughout ED course.     Medications given in ED as applicable:    Medications   sodium chloride 0.9 % bolus 1,000 mL (0 mLs Intravenous Infusion Complete 06/15/17 1559)   dexamethasone (DECADRON) injection 10 mg (10 mg Intravenous Given 06/15/17 1326)     Patient Vitals for the past 24 hrs:   BP Temp Temp src Pulse Resp SpO2 Height Weight   06/15/17 1633 (!) 156/96 -- -- 52 -- 95 % -- --   06/15/17 1216 (!) 147/105 36.4 C (97.5 F) Oral 64 18 98 % 1.753 m (5\' 9" ) (!) 128.8 kg (284 lb)     All pertinent history, physical exam findings, and evaluation/treatment modalities/results reviewed urgently as they became available and were addressed during hospital course as those that were available at time of disposition.     Discussed results with patient and answered questions as posed as best as possible.  Patient verbalizes understanding. Patient recommended for further workup, evaluation, and treatment as deemed appropriate.      Patient improved. No complaints at time of discharge. Answered all questions asked by patient and family as posed. Fair comprehension. Agreed on discharge, return to ED as needed, and follow up as directed.    Discharge Medication List as of 06/15/2017  4:36 PM     START taking these medications    Details   benzonatate (TESSALON) 100 MG capsule Take 1 capsule (100 mg total) by mouth 3  (three) times daily as needed for Cough, Starting Thu 06/15/2017, Print     fexofenadine (ALLEGRA) 60 MG tablet Take 1 tablet (60 mg total) by mouth 2 (two) times daily, Starting Thu 06/15/2017, Until Fri 06/15/2018, Print     fluticasone (FLONASE) 50 mcg/actuation nasal spray Place 1 spray into each nostril daily Use daily for at least 2 weeks, Starting Thu 06/15/2017, Until Fri 06/15/2018, Print         Follow up:    Haywood Filler, MD  [address]  [place] [address]  [phone]    Call in 2 days    This encounter was dictated with voice recognition software and may contain inadvertent recognition errors.    ED Diagnosis     Final diagnoses:   Acute viral bronchitis       Starr Sinclair, NP  06/15/17 1700      Mariea Stable, MD  06/22/17 2042    Electronically signed by Mariea Stable, MD at 06/22/2017  8:42 PM EST    Associated attestation - Mariea Stable, MD - 06/22/2017  8:42 PM EST  Formatting of this note might be different from the original.  Patient was  seen  by a Physician Assistant/Nurse Practitioner and not personally by me. I was available for consultation while the patient was in the emergency department. I have reviewed and am in agreement with the documented history, exam, workup interpretation, diagnosis, disposition, and treatment plan.

## 2017-06-15 NOTE — ED Notes (Signed)
Formatting of this note might be different from the original.  Pt updated on status, to have repeat troponin drawn.  Rationale for repeat lab provided, pt denied any questions or concerns.   Electronically signed by Lynett Fish, RN at 06/15/2017  4:45 PM EST

## 2017-06-15 NOTE — ED Notes (Signed)
Formatting of this note might be different from the original.  Pt in bed, resting quietly.  Side rails up, call light within reach, and visitor at bedside.  Pt updated on status, denied any questions or needs at this time.   Electronically signed by Lynett Fish, RN at 06/15/2017  4:46 PM EST

## 2017-06-15 NOTE — ED Notes (Signed)
Formatting of this note might be different from the original.  Pt given urine cup and encouraged to void as soon as possible.   Electronically signed by Lynett Fish, RN at 06/15/2017 12:28 PM EST

## 2017-06-15 NOTE — ED Notes (Signed)
Formatting of this note might be different from the original.  Pt in no apparent distress at discharge.  Discharge, prescription, and follow up information provided, pt denied any questions or concerns.  Pt ambulated from room with steady gait. Family to transport patient home.      Electronically signed by Lynett Fish, RN at 06/15/2017  4:44 PM EST

## 2017-06-22 NOTE — ED Notes (Signed)
Formatting of this note might be different from the original.  Discharge instructions, prescriptions, and follow up instructions discussed with patient. Verbalizes understanding of all information and denies questions/concerns. Patient ambulatory with steady gait to lobby upon discharge.  Electronically signed by Myles Rosenthal, RN MSN at 06/22/2017  7:04 PM EST

## 2017-06-22 NOTE — ED Notes (Signed)
Formatting of this note might be different from the original.  Patient with SOB and cough for 2 weeks. States this is his 3rd ER visit. Diagnosed by another facility with bronchitis and given steroid which "really seemed to help" and antibiotics which he states he finished 3 days ago. Also given inhaler (maybe albuterol?) which he has been using. States SOB has become increasingly worse in the past few days. Patient with inspiratory and expiratory wheezes in the bilateral lower lobes. Strong cough noted with deep inspiration. Patient appears dyspneic at rest and on exertion. Difficulty speaking in complete sentences.  Electronically signed by Myles Rosenthal, RN MSN at 06/22/2017  6:34 PM EST

## 2017-06-22 NOTE — ED Provider Notes (Signed)
Formatting of this note is different from the original.  Provider Triage Note    HPI: Christopher Peters is a 57 y.o. male who presents with worsening productive cough with yellowish sputum, shortness of breath, chest wall pain for the last 2 weeks.  Patient states he was diagnosed with bronchitis about 2 weeks ago and prescribed doxycycline.  Patient states he completed a course of antibiotics, using Albuterol at home without any improvement.  He states that he was given Decadron injection while in ED on 06/15/17 which did help his symptoms intermittently; however, symptoms have returned. Denies any fever, chills, hemoptysis.     REVIEW OF SYSTEMS    All other systems negative except as reported in the history of present illness.    PHYSICAL EXAM    General:  No acute distress.   Heart:  RRR  Pulmonary:  No acute respiratory distress  Triage Recommendation  Chart signed by Marisue Humble, PA-C  Patient sent to main ED/FT for further exam and final disposition    eMERGENCY dEPARTMENT eNCOUnter    [facility]  [place], [place] [address]    History   CHIEF COMPLAINT  Chief Complaint   Patient presents with   ? Cough     HPI   Christopher Peters is a 57 y.o. male who presents planing of worsening productive cough, chest pain that worsens with deep breath and coughing and shortness of breath for 2 weeks.  States she was diagnosed with bronchitis 2 weeks ago and was prescribed doxycycline without any relief.  Denies of any fever, chills, dizziness, sore throat, body ache, leg swelling, palpitation, night sweats or hemoptysis    REVIEW OF SYSTEMS    Review of Systems   Constitutional: Negative for chills, fever and malaise/fatigue.   HENT: Negative.  Negative for ear pain and sore throat.    Eyes: Negative.    Respiratory: Positive for cough, sputum production and shortness of breath. Negative for hemoptysis.    Cardiovascular: Negative for chest pain, palpitations and leg swelling.   Gastrointestinal:  Negative for abdominal pain and vomiting.   Genitourinary: Negative.    Musculoskeletal: Negative.    Skin: Negative.    Neurological: Negative.  Negative for dizziness.   Psychiatric/Behavioral: Negative.      All other systems negative except as reported in the history of present illness.    PAST MEDICAL HISTORY    Past Medical History:   Diagnosis Date   ? Hypertension    ? Kidney stones    ? Renal disorder      SURGICAL HISTORY    Past Surgical History:   Procedure Laterality Date   ? FRACTURE SURGERY     ? NEPHRECTOMY Left      CURRENT MEDICATIONS      Current Facility-Administered Medications:   ?  ipratropium-albuterol (DUO-NEB) 0.5-2.5 mg/3 mL nebulizer solution, , , ,     Current Outpatient Prescriptions:   ?  albuterol (PROVENTIL HFA;VENTOLIN HFA) 90 mcg/actuation inhaler, Inhale 1-2 puffs every six (6) hours as needed., Disp: 1 Inhaler, Rfl: 0  ?  atenolol (TENORMIN) 50 MG tablet, Take 50 mg by mouth daily., Disp: , Rfl: 0  ?  benzonatate (TESSALON) 100 MG capsule, Take 1 capsule (100 mg total) by mouth Three (3) times a day as needed for cough. for up to 3 days, Disp: 12 capsule, Rfl: 0  ?  bumetanide (BUMEX) 0.5 MG tablet, Take 0.5 mg by mouth daily., Disp: , Rfl:   ?  ergocalciferol (VITAMIN D2) 50,000 unit capsule, Take 50,000 Units by mouth once a week., Disp: , Rfl:   ?  fluticasone (FLONASE) 50 mcg/actuation nasal spray, 1 spray by Each Nare route daily., Disp: , Rfl:   ?  gabapentin (NEURONTIN) 300 MG capsule, Take 300 mg by mouth Three (3) times a day., Disp: , Rfl: 2  ?  GENTLE LAXATIVE 5 mg EC tablet, Take 5 mg by mouth daily., Disp: , Rfl: 11  ?  levoFLOXacin (LEVAQUIN) 750 MG tablet, Take 1 tablet (750 mg total) by mouth daily. for 10 days, Disp: 10 tablet, Rfl: 0  ?  omeprazole (PRILOSEC) 20 MG capsule, Take 20 mg by mouth daily., Disp: , Rfl: 0  ?  ondansetron (ZOFRAN-ODT) 4 MG disintegrating tablet, Take 4 mg by mouth every six (6) hours as needed for nausea or vomiting., Disp: , Rfl: 0  ?   predniSONE (DELTASONE) 20 MG tablet, Take 2 tablets (40 mg total) by mouth daily. for 4 days, Disp: 8 tablet, Rfl: 0  ?  ranitidine (ZANTAC) 300 MG tablet, Take 300 mg by mouth nightly., Disp: , Rfl: 0  ?  senna-docusate (PERICOLACE) 8.6-50 mg, Take 1 tablet by mouth daily. October 2018, Disp: , Rfl:     ALLERGIES    Allergies   Allergen Reactions   ? Nsaids (Non-Steroidal Anti-Inflammatory Drug) Other (See Comments)     Kidney removal    ? Codeine Anxiety   ? Ketorolac Other (See Comments)     Pt told not to take d/t kidney issues     FAMILY HISTORY    History reviewed. No pertinent family history.    SOCIAL HISTORY    Social History     Social History   ? Marital status: Married     Spouse name: N/A   ? Number of children: N/A   ? Years of education: N/A     Social History Main Topics   ? Smoking status: Current Every Day Smoker     Packs/day: 1.00     Types: Cigarettes   ? Smokeless tobacco: Former Neurosurgeon     Types: Chew   ? Alcohol use Yes      Comment: rarely   ? Drug use: No   ? Sexual activity: Not Asked     Other Topics Concern   ? None     Social History Narrative   ? None     Physical Exam     Vitals:    06/22/17 1441 06/22/17 1654 06/22/17 1832   BP: 139/77 138/81 144/84   Pulse: 77 77 73   Resp: Temp: 36.9 C (98.4 F)     TempSrc: Oral     SpO2: 98% 98% 98%   Weight: (!) 113.4 kg (250 lb)     Height: 175.3 cm ( )       Physical Exam   Constitutional: He is oriented to person, place, and time. He appears well-developed and well-nourished. No distress.   HENT:   Head: Normocephalic and atraumatic.   Mouth/Throat: Oropharynx is clear and moist. No oropharyngeal exudate.   Eyes: Pupils are equal, round, and reactive to light. Conjunctivae and EOM are normal.   Neck: Normal range of motion. Neck supple.   Cardiovascular: Normal rate, regular rhythm, normal heart sounds and intact distal pulses.    Pulmonary/Chest: Effort normal and breath sounds normal. No respiratory distress. He has no  wheezes. He has no rales. He exhibits no  tenderness.   Abdominal: Soft. Bowel sounds are normal. There is no tenderness. There is no rebound.   Musculoskeletal: Normal range of motion.   Lymphadenopathy:     He has no cervical adenopathy.   Neurological: He is alert and oriented to person, place, and time.   Skin: Skin is warm and dry. He is not diaphoretic.   Psychiatric: He has a normal mood and affect. His behavior is normal. Judgment and thought content normal.     Results     Vitals:    06/22/17 1441 06/22/17 1654 06/22/17 1832   BP: 139/77 138/81 144/84   Pulse: 77 77 73   Resp: 20 18 19    Temp: 36.9 C (98.4 F)     TempSrc: Oral     SpO2: 98% 98% 98%   Weight: (!) 113.4 kg (250 lb)     Height: 175.3 cm (5\' 9" )       Labs Reviewed   D-DIMER, QUANTITATIVE - Abnormal; Notable for the following:        Result Value    D-Dimer 1.63 (*)     All other components within normal limits    Narrative:     The D-Dimer assay is intended for use as an aid in the diagnosis of venous thromboembolism (VTE)    CBC W/ AUTO DIFF - Abnormal; Notable for the following:     MCV 94.9 (*)     Platelet 122 (*)     Absolute Lymphocytes 1.0 (*)     All other components within normal limits   CBC W/ DIFFERENTIAL    Narrative:     The following orders were created for panel order CBC w/ Differential.  Procedure                               Abnormality         Status                     ---------                               -----------         ------                     CBC w/ Differential[[id number]]         Abnormal            Final result                 Please view results for these tests on the individual orders.   BASIC METABOLIC PANEL     CT Chest W Contrast   Final Result     1.  Moderately limited exam.  No pulmonary embolism to the segmental branches.     2.  No aortic dissection or aneurysm.     3.  Prominent heart size and pulmonary vascularity, probably artifact from hypoinflation.  Correlate for volume overload on clinical  exam.     Signed (Electronic Signature): 06/22/2017 6:23 PM   Signed By:  Alex Gardener     XR Chest 2 views    (Results Pending)     Medications   ipratropium-albuterol (DUO-NEB) 0.5-2.5 mg/3 mL nebulizer solution (not administered)   ipratropium-albuterol (DUO-NEB) 0.5-2.5 mg/3 mL nebulizer solution 3 mL (3 mL Nebulization Given 06/22/17 1451)  methylPREDNISolone sodium succinate (PF) (Solu-MEDROL) injection 125 mg (125 mg Intravenous Given 06/22/17 1634)   iodixanol (VISIPAQUE) 320 mg iodine/mL solution 100 mL (100 mL Intravenous Given 06/22/17 1812)     EKG    EKG: Normal sinus rhythm at 70 bmp, PR 178, QRS 107, without ectopy or ST Elevation. Interpreted by me initially and signed by attending.    Progress Notes   6:39 PM  Patient NAD sitting comfortably on stretcher.  Vital signs stable.  D-dimer was elevated so CT chest was ordered.  No PE noted on CT.  Will discharge with PMD follow-up.  Patient states he has taken cough medication with codeine before without any reaction.    Disposition     Counseled Patient/Family: Old records and diagnostic tests were reviewed, and all questions were answered. Diagnosis, care plan and treatment options were discussed. The patient/family member understands instructions and will follow up as directed and advised to return if symptoms do not improve, worsen or unable to follow up.    Clinical Impression:    ICD-10-CM   1. Bronchitis J40       Discharge Medications:  New Prescriptions    ALBUTEROL (PROVENTIL HFA;VENTOLIN HFA) 90 MCG/ACTUATION INHALER    Inhale 1-2 puffs every six (6) hours as needed.    BENZONATATE (TESSALON) 100 MG CAPSULE    Take 1 capsule (100 mg total) by mouth Three (3) times a day as needed for cough. for up to 3 days    LEVOFLOXACIN (LEVAQUIN) 750 MG TABLET    Take 1 tablet (750 mg total) by mouth daily. for 10 days    PREDNISONE (DELTASONE) 20 MG TABLET    Take 2 tablets (40 mg total) by mouth daily. for 4 days     Santiago Glad, PA-C  Emergency  Department    This note was created in part by [employer] recognition software.  Please excuse any spelling or grammatical errors.      [address] [place], [place]  06/23/17 2303    Electronically signed by Sherilyn Cooter, MD at 06/25/2017 12:14 AM EST    Associated attestation - Sherilyn Cooter, MD - 06/25/2017 12:14 AM EST  Formatting of this note might be different from the original.  I have reviewed and agree with the NP/PA documentation, treatment plan and medical decision making.

## 2017-06-22 NOTE — Unmapped (Signed)
Formatting of this note might be different from the original.  Pt c/o worsening cough and sob - cough prod of yellowish - white sputum - reports dx bronchitis 2 weeks ago, completed abx as prescribed, no improvement on abx - states only thing that help was a steroid shot but it only lasted a couple of days, and ever since it's been getting worse and worse" pt speaking in full sentences w/o pauses in triage.   Electronically signed by Pat Patrick, RN at 06/22/2017  2:41 PM EST

## 2017-07-23 NOTE — ED Provider Notes (Signed)
Formatting of this note is different from the original.  HPI     Chief Complaint   Patient presents with   ? Abdominal Pain   ? Cough     57yo CM with hx kidney stones and left nephrectomy presents for abdominal pain, burning with urination and cough. Pt states he is having sharp pain and a bulge along his mid abdominal incision when he coughs. Patient was seen and treated in [place], [place] for bronchitis about 1 month ago, but states his cough is persistent.    History provided by:  Patient  Abdominal Pain   Associated symptoms: cough and dysuria    Associated symptoms: no chest pain, no chills, no diarrhea, no fever, no hematuria and no vomiting    Cough   Associated symptoms: no chest pain, no chills and no fever       Patient History     No past medical history on file.    No past surgical history on file.    No family history on file.    Social History   Substance Use Topics   ? Smoking status: Not on file   ? Smokeless tobacco: Not on file   ? Alcohol use Not on file      Review of Systems     Review of Systems   Constitutional: Negative.  Negative for chills and fever.   HENT: Negative.    Eyes: Negative.    Respiratory: Positive for cough. Negative for chest tightness.    Cardiovascular: Negative.  Negative for chest pain.   Gastrointestinal: Positive for abdominal pain. Negative for blood in stool, diarrhea and vomiting.   Genitourinary: Positive for dysuria. Negative for flank pain and hematuria.   Musculoskeletal: Negative.    Skin: Negative.    Neurological: Negative.  Negative for dizziness, syncope and light-headedness.   Hematological: Negative.    Psychiatric/Behavioral: Negative.       Physical Exam     ED Triage Vitals [07/23/17 1901]   Temp Heart Rate Resp BP SpO2 Weight   36.8 C (98.2 F) 72 16 (!) 203/95 99 % 123 kg (271 lb)     Temp Source Heart Rate Source Patient Position BP Location FiO2 Weight Method   Oral Monitor Sitting Right arm -- Stated       Pulse Ox/Rhythm Strip Interpretation     No  data found.       Physical Exam   Constitutional: He is oriented to person, place, and time. He appears well-developed and well-nourished.   HENT:   Head: Atraumatic.   Eyes: Conjunctivae and EOM are normal.   Neck: Normal range of motion.   Cardiovascular: Normal rate and regular rhythm.    Pulmonary/Chest: Effort normal and breath sounds normal.   Abdominal: Soft. Bowel sounds are normal. There is tenderness (along midline incisional scar).   Musculoskeletal: Normal range of motion.   Neurological: He is alert and oriented to person, place, and time.   Skin: Skin is warm and dry.   Psychiatric: He has a normal mood and affect.         Procedures    ED Course & MDM     ED Course as of Jul 23 2056   Sun Jul 23, 2017   1940 57yo CM with hx kidney stones and left nephrectomy presents for abdominal pain, burning with urination and cough. Pt states he is having sharp pain and a bulge along his mid abdominal incision when he coughs. Patient  was seen and treated in [place], [place] for bronchitis about 1 month ago, but states his cough is persistent.  Plan: Labs, CXR, CT abd/pelvis with IV contrast, Duoneb treatment, re-evaluate  [MH]   2056 1.  Uncomplicated supraumbilical containing hernia.  2.  2. Status post left nephrectomy.  3.  3. Probable right renal cyst measuring 4 seniors comparison outside studies. CT abdomen pelvis without contrast [MH]   2057 No findings of acute cardiopulmonary disease. X-ray chest 2 views [MH]   2057 Case discussed with Dr. Lou Cal. Pt has fat containing hernia. Will give stool softener and have follow up with surgeon.  [MH]     ED Course User Index  [MH] Daine Gravel, [place]     MDM  Number of Diagnoses or Management Options  Diagnosis management comments: CT reveals fat containing hernia. UA and labs unremarkable. CXR clear. Case discussed with Dr. Lou Cal, will give patient pain medication, stool softener, and have follow up with his surgeon.      Amount and/or Complexity of Data  Reviewed  Clinical lab tests: ordered and reviewed  Tests in the radiology section of CPT: ordered and reviewed    Risk of Complications, Morbidity, and/or Mortality  Presenting problems: low  Diagnostic procedures: low  Management options: low    Patient Progress  Patient progress: stable          Daine Gravel, PA  07/23/17 [address], [place]  07/23/17 2101    Electronically signed by Loree Fee, MD at 07/26/2017  8:50 AM EST    Associated attestation - Loree Fee, MD - 07/26/2017  8:50 AM EST  Formatting of this note might be different from the original.  I have personally seen and examined this patient.  I have fully participated in the care of this patient.  I have reviewed all pertinent clinical information, including history, physical exam and plan.

## 2017-07-23 NOTE — Unmapped (Signed)
Formatting of this note might be different from the original.  Dc home with family with rx.  Electronically signed by Donnetta Hail, RN at 07/23/2017  9:31 PM EST

## 2017-07-23 NOTE — ED Triage Notes (Signed)
Formatting of this note might be different from the original.  Pt has kidney removed 1 year ago, sts he has had a cough that has caused acute pain along his mid abdominal incision lien when he coughs. Pain is a constant and dull but sharp stabbing pain when he coughts pt sts "feels like its going to jump out when I cough."  Electronically signed by Inez Pilgrim, RN at 07/23/2017  7:05 PM EST

## 2018-06-17 ENCOUNTER — Other Ambulatory Visit: Payer: Self-pay

## 2018-06-17 ENCOUNTER — Encounter (HOSPITAL_BASED_OUTPATIENT_CLINIC_OR_DEPARTMENT_OTHER): Payer: Self-pay | Admitting: Emergency Medicine

## 2018-06-17 ENCOUNTER — Emergency Department (HOSPITAL_BASED_OUTPATIENT_CLINIC_OR_DEPARTMENT_OTHER)
Admission: EM | Admit: 2018-06-17 | Discharge: 2018-06-17 | Disposition: A | Discharge status: Home / Self Care | Payer: Medicaid - Out of State | Attending: Emergency Medicine | Admitting: Emergency Medicine

## 2018-06-17 ENCOUNTER — Emergency Department (HOSPITAL_BASED_OUTPATIENT_CLINIC_OR_DEPARTMENT_OTHER): Payer: Medicaid - Out of State

## 2018-06-17 DIAGNOSIS — I10 Essential (primary) hypertension: Secondary | ICD-10-CM | POA: Insufficient documentation

## 2018-06-17 DIAGNOSIS — Z79899 Other long term (current) drug therapy: Secondary | ICD-10-CM | POA: Insufficient documentation

## 2018-06-17 DIAGNOSIS — R1031 Right lower quadrant pain: Secondary | ICD-10-CM | POA: Diagnosis not present

## 2018-06-17 DIAGNOSIS — R109 Unspecified abdominal pain: Secondary | ICD-10-CM

## 2018-06-17 DIAGNOSIS — M5489 Other dorsalgia: Secondary | ICD-10-CM | POA: Diagnosis present

## 2018-06-17 DIAGNOSIS — R319 Hematuria, unspecified: Secondary | ICD-10-CM | POA: Insufficient documentation

## 2018-06-17 DIAGNOSIS — F17200 Nicotine dependence, unspecified, uncomplicated: Secondary | ICD-10-CM | POA: Insufficient documentation

## 2018-06-17 HISTORY — DX: Essential (primary) hypertension: I10

## 2018-06-17 HISTORY — DX: Disorder of kidney and ureter, unspecified: N28.9

## 2018-06-17 LAB — URINALYSIS, ROUTINE W REFLEX MICROSCOPIC
Bilirubin Urine: NEGATIVE
Glucose, UA: NEGATIVE mg/dL
Hgb urine dipstick: NEGATIVE
KETONES UR: NEGATIVE mg/dL
Leukocytes, UA: NEGATIVE
Nitrite: NEGATIVE
PH: 6 (ref 5.0–8.0)
Protein, ur: NEGATIVE mg/dL
Specific Gravity, Urine: 1.025 (ref 1.005–1.030)

## 2018-06-17 LAB — CBC WITH DIFFERENTIAL/PLATELET
ABS IMMATURE GRANULOCYTES: 0.01 10*3/uL (ref 0.00–0.07)
Basophils Absolute: 0 10*3/uL (ref 0.0–0.1)
Basophils Relative: 1 %
Eosinophils Absolute: 0.2 10*3/uL (ref 0.0–0.5)
Eosinophils Relative: 4 %
HCT: 45.7 % (ref 39.0–52.0)
HEMOGLOBIN: 14.8 g/dL (ref 13.0–17.0)
Immature Granulocytes: 0 %
LYMPHS PCT: 28 %
Lymphs Abs: 1.8 10*3/uL (ref 0.7–4.0)
MCH: 31.2 pg (ref 26.0–34.0)
MCHC: 32.4 g/dL (ref 30.0–36.0)
MCV: 96.4 fL (ref 80.0–100.0)
Monocytes Absolute: 0.4 10*3/uL (ref 0.1–1.0)
Monocytes Relative: 6 %
NEUTROS ABS: 3.9 10*3/uL (ref 1.7–7.7)
Neutrophils Relative %: 61 %
Platelets: 136 10*3/uL — ABNORMAL LOW (ref 150–400)
RBC: 4.74 MIL/uL (ref 4.22–5.81)
RDW: 13.3 % (ref 11.5–15.5)
WBC: 6.3 10*3/uL (ref 4.0–10.5)
nRBC: 0 % (ref 0.0–0.2)

## 2018-06-17 LAB — COMPREHENSIVE METABOLIC PANEL
ALT: 25 U/L (ref 0–44)
AST: 22 U/L (ref 15–41)
Albumin: 4.2 g/dL (ref 3.5–5.0)
Alkaline Phosphatase: 51 U/L (ref 38–126)
Anion gap: 6 (ref 5–15)
BUN: 15 mg/dL (ref 6–20)
CO2: 23 mmol/L (ref 22–32)
Calcium: 8.7 mg/dL — ABNORMAL LOW (ref 8.9–10.3)
Chloride: 107 mmol/L (ref 98–111)
Creatinine, Ser: 1.31 mg/dL — ABNORMAL HIGH (ref 0.61–1.24)
GFR calc Af Amer: >60 mL/min (ref >60)
GFR calc non Af Amer: >60 mL/min (ref >60)
Glucose, Bld: 100 mg/dL — ABNORMAL HIGH (ref 70–99)
POTASSIUM: 4.1 mmol/L (ref 3.5–5.1)
SODIUM: 136 mmol/L (ref 135–145)
Total Bilirubin: 1 mg/dL (ref 0.3–1.2)
Total Protein: 7 g/dL (ref 6.5–8.1)

## 2018-06-17 LAB — I-STAT CG4 LACTIC ACID, ED: Lactic Acid, Venous: 1.04 mmol/L (ref 0.5–1.9)

## 2018-06-17 MED ORDER — ONDANSETRON 4 MG PO TBDP: 4.0000 mg | ORAL_TABLET | Freq: Three times a day (TID) | ORAL | 0 refills | Status: AC | PRN

## 2018-06-17 MED ORDER — NICOTINE 21 MG/24HR TD PT24
21.0000 mg | MEDICATED_PATCH | Freq: Once | TRANSDERMAL | Status: DC
  Administered 2018-06-17: 21 mg via TRANSDERMAL
  Filled 2018-06-17: qty 1

## 2018-06-17 MED ORDER — HYDROMORPHONE HCL 1 MG/ML IJ SOLN
1.0000 mg | Freq: Once | INTRAMUSCULAR | Status: AC
  Administered 2018-06-17: 1 mg via INTRAVENOUS
  Filled 2018-06-17: qty 1

## 2018-06-17 MED ORDER — OXYCODONE-ACETAMINOPHEN 5-325 MG PO TABS: 1.0000 | ORAL_TABLET | ORAL | 0 refills | Status: DC | PRN

## 2018-06-17 MED ORDER — FENTANYL CITRATE (PF) 100 MCG/2ML IJ SOLN
50.0000 ug | INTRAMUSCULAR | Status: DC | PRN
  Administered 2018-06-17: 50 ug via INTRAVENOUS
  Filled 2018-06-17: qty 2

## 2018-06-17 NOTE — ED Provider Notes (Signed)
[facility] EMERGENCY DEPARTMENT Provider Note   CSN: [id number] Arrival date & time: 06/17/18  1420     History   Chief Complaint Chief Complaint  Patient presents with  . Back Pain    HPI Christopher Archer is a 58 y.o. male.  The history is provided by the patient and medical records.  Back Pain    58 year old male with history of hypertension and kidney stones, presenting to the ED for flank pain and difficulty urinating.  [place] in town [place] from [place].  Reports last evening he started having some dull, aching pain in his back that has progressively worsened today.  Pain comes in waves and feels like "stabbing".  Reports he has noticed a lot of blood in his urine and urine has had somewhat of a foul smell.  He denies any nausea or vomiting.  Does follow closely with a urologist back home.  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  He was given fentanyl prior to my assessment without relief.  Past Medical History:  Diagnosis Date  . Hypertension   . 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   Past Surgical History:  Procedure Laterality Date  . KIDNEY CYST REMOVAL          Home Medications    Prior to Admission medications   Medication Sig Start Date End Date Taking? Authorizing Provider  albuterol (VENTOLIN HFA) 108 (90 Base) MCG/ACT inhaler 1-2 inhalations every 4-6 hours as needed for wheezing. Dispense spacer as needed. 04/27/18 04/27/19 Yes [provider]  omeprazole (PRILOSEC) 20 MG capsule Take by mouth. 09/16/16  Yes [provider]  atenolol (TENORMIN) 50 MG tablet Take by mouth.    [provider]  clonazePAM (KLONOPIN) 0.5 MG tablet Take by mouth.    [provider]  Ergocalciferol POWD Take by mouth.    [provider]    Family History History reviewed. No  pertinent family history.  Social History Social History   Tobacco Use  . Smoking status: Current Every Day Smoker  . Smokeless tobacco: Never Used  Substance Use Topics  . Alcohol use: Never    Frequency: Never  . Drug use: Never     Allergies   Codeine   Review of Systems Review of Systems  Genitourinary: Positive for difficulty urinating, flank pain and hematuria.  Musculoskeletal: Positive for back pain.  All other systems reviewed and are negative.    Physical Exam Updated Vital Signs BP (!) 145/88 (BP Location: Left Arm)   Pulse 78   Temp (!) 100.5 F (38.1 C) (Oral)   Resp (!) 21   Ht 5\' 9"  (1.753 m)   Wt 117.9 kg   SpO2 100%   BMI 38.40 kg/m   Physical Exam Vitals signs and nursing note reviewed.  Constitutional:      Appearance: He is well-developed.  HENT:     Head: Normocephalic and atraumatic.  Eyes:     Conjunctiva/sclera: Conjunctivae normal.     Pupils: Pupils are equal, round, and reactive to light.  Neck:     Musculoskeletal: Normal range of motion.  Cardiovascular:     Rate and Rhythm: Normal rate and regular rhythm.     Heart sounds: Normal heart sounds.  Pulmonary:     Effort: Pulmonary effort is normal.     Breath  sounds: Normal breath sounds.  Abdominal:     General: Bowel sounds are normal.     Palpations: Abdomen is soft.     Tenderness: There is no abdominal tenderness. There is right CVA tenderness.  Musculoskeletal: Normal range of motion.  Skin:    General: Skin is warm and dry.  Neurological:     Mental Status: He is alert and oriented to person, place, and time.      ED Treatments / Results  Labs (all labs ordered are listed, but only abnormal results are displayed) Labs Reviewed  COMPREHENSIVE METABOLIC PANEL - Abnormal; Notable for the following components:      Result Value   Glucose, Bld 100 (*)    Creatinine, Ser 1.31 (*)    Calcium 8.7 (*)    All other components within normal limits  CBC WITH  DIFFERENTIAL/PLATELET - Abnormal; Notable for the following components:   Platelets 136 (*)    All other components within normal limits  URINE CULTURE  CULTURE, BLOOD (ROUTINE X 2)  CULTURE, BLOOD (ROUTINE X 2)  URINALYSIS, ROUTINE W REFLEX MICROSCOPIC  I-STAT CG4 LACTIC ACID, ED    EKG None  Radiology Ct Renal Stone Study  Result Date: 06/17/2018 CLINICAL DATA:  Hematuria for 2 days. Decreased urine output. History of chronic renal stones. Left nephrectomy due to stones. No current pain. EXAM: CT ABDOMEN AND PELVIS WITHOUT CONTRAST TECHNIQUE: Multidetector CT imaging of the abdomen and pelvis was performed following the standard protocol without IV contrast. COMPARISON:  None. FINDINGS: Lower chest: No acute abnormality. Hepatobiliary: No focal liver abnormality is seen. No gallstones, gallbladder wall thickening, or biliary dilatation. Pancreas: There are 2 regions of soft tissue attenuation between the pancreatic head and the duodenal sweep measuring 2.1 x 1.2 cm on axial image 36 and 1.7 by 1.3 cm on axial image 39. It is unclear whether these 2 soft tissue nodules arise from the pancreatic head, the duodenum, or represent soft tissue between the pancreatic head and duodenum. The remainder of the pancreas is normal. Spleen: Normal in size without focal abnormality. Adrenals/Urinary Tract: The adrenal glands are normal. Post left nephrectomy changes. There is a 4.2 cm cyst in the upper medial right kidney with an attenuation of 7 Hounsfield units. No other renal masses identified on the right. No hydronephrosis or perinephric stranding. Mild stranding adjacent the right kidney is age indeterminate but may be nonacute. The right ureter is normal in appearance. The kidneys normal. Stomach/Bowel: The stomach is normal. Fecal material in the distal ileum suggests slow transit time. The remainder of the small bowel is normal. Colonic diverticulosis without diverticulitis. Colon is unremarkable. The  appendix is not visualized. 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 No adenopathy. Reproductive: Prostate is unremarkable. Other: Anterior ventral hernias.  No other abnormalities. Musculoskeletal: No acute or significant osseous findings. IMPRESSION: 1. No cause for hematuria and decreased urine output identified. The patient is status post left nephrectomy. There is a 4.2 cm cyst off the upper right kidney. There is no hydronephrosis or stone. There is mild stranding around the right kidney which may be nonacute but is age indeterminate given the lack of previous studies. 2. 2 rounded regions of soft tissue which could arise from the pancreatic head, the duodenum, or represent soft tissue nodules such as lymph nodes between the pancreatic head and duodenum. Recommend further evaluation with a contrast-enhanced CT scan with intravenous and oral contrast. These findings are not consistent with the patient's acute symptoms and  the follow-up CT scan could be performed as an outpatient. 3. Fecal material in the distal ileum suggest slow transit. The stomach, small bowel, and colon are otherwise normal. 4. 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 Electronically Signed   By: Gerome Samavid  Williams III M.D   On: 06/17/2018 15:59    Procedures Procedures (including critical care time)  Medications Ordered in ED Medications  fentaNYL (SUBLIMAZE) injection 50 mcg (50 mcg Intravenous Given 06/17/18 1459)  nicotine (NICODERM CQ - dosed in mg/24 hours) patch 21 mg (21 mg Transdermal Patch Applied 06/17/18 1714)  HYDROmorphone (DILAUDID) injection 1 mg (1 mg Intravenous Given 06/17/18 1513)  HYDROmorphone (DILAUDID) injection 1 mg (1 mg Intravenous Given 06/17/18 1714)     Initial Impression / Assessment and Plan / ED Course  I have reviewed the triage vital signs and the nursing notes.  Pertinent labs & imaging results that were available during my care of the patient were  reviewed by me and considered in my medical decision making (see chart for details).  58 year old male here with flank pain, difficulty urinating, and hematuria since last evening.  On arrival, low-grade fever here but overall nontoxic in appearance.  He does appear uncomfortable.  Does have some right CVA tenderness but no peritoneal signs.  No significant distention over the bladder.  Patient's labs are overall reassuring-- lactate and WBC count normal, serum creatinine 1.3 which is around his baseline compared with prior values.  UA without signs of infection or blood.  CT renal study without acute findings about the urinary tract.  There are some incidental findings of possible cyst along head of pancreas vs duodenum vs enlarged lymph nodes.  Patient without current epigastric pain, nausea, or vomiting.  Symptoms not clinically consistent with pancreatitis.  On re-check temp normal at 98.  Pain has resolved, he remains non-toxic in appearance.  At this point, do not feel patient requires further work-up here.  Blood and urine cultures were sent.  Patient is planning to stay in [place] a while longer, will refer to urology here if any ongoing issues.   Rx for symptomatic care if needed.  Can return to ED for any new/acute changes.    Final Clinical Impressions(s) / ED Diagnoses   Final diagnoses:  Flank pain    ED Discharge Orders         Ordered    oxyCODONE-acetaminophen (PERCOCET) 5-325 MG tablet  Every 4 hours PRN     06/17/18 1815    ondansetron (ZOFRAN ODT) 4 MG disintegrating tablet  Every 8 hours PRN     06/17/18 1815           Garlon HatchetSanders, Rochele Lueck M, PA-C 06/17/18 1849    Tegeler, Canary Brimhristopher J, MD 06/18/18 [phone]

## 2018-06-17 NOTE — ED Triage Notes (Signed)
Patient states that he is having bilateral lower back pain - with blood in the urine. 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

## 2018-06-17 NOTE — ED Notes (Signed)
Pt unable to urinate at this time.  

## 2018-06-17 NOTE — Discharge Instructions (Addendum)
As we discussed, results looked good overall today.   No signs of kidney stone or kidney infection.   There was an incidental finding of a possible pancreatic cyst-- this will just need follow-up at a later time with your doctor once you return home. Since you are staying in [place] a while longer, can follow-up with urology clinic if any ongoing issues. Can call their office for appt if needed. Please return here for any new/acute changes.

## 2018-06-19 LAB — URINE CULTURE: Culture: NO GROWTH

## 2018-06-22 LAB — CULTURE, BLOOD (ROUTINE X 2)
CULTURE: NO GROWTH
Culture: NO GROWTH
Special Requests: ADEQUATE
Special Requests: ADEQUATE

## 2018-09-21 NOTE — ED Provider Notes (Signed)
Formatting of this note is different from the original.  EMERGENCY DEPARTMENT ENCOUNTER    History  Chief Complaint   Patient presents with   ? COLD   ? Back Pain     LOW BACK PAIN.  PRIMARILY LEFT SIDE.  SOME PAIN RADIATING DOWN LEFT LEG     The history is provided by the patient.     PCP is None, Physician.    HPI:   Chief Complaint: Low back pain, mid abdominal pain    Context: Pt is a 58 yr/o male who presents with a few day history of left-sided low back pain along with left mid abdominal pain.  Patient states the last 4 years he has had occasional flareups of left-sided low back pain.  He states this left-sided low back pain began recently.  He states it is sharp.  He denies radicular symptoms.  He states for actually 1-1/2 years he has had intermittent paresthesias on the lateral aspect of his left thigh.  He denies new paresthesias, weakness, fever, recent illness, GU pain or swelling, bowel or bladder abnormalities.  In addition he states he has discomfort located over his mid abdominal hernia.  He denies vomiting.  In addition he denies syncope, headache, chest pain, cough, dyspnea.  He denies recent fall trauma or injury.  He also denies recent lifting or bending.  He states his family member drove him to the ED.  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        Past Medical History:   Diagnosis Date   ? Hypertension    ? Kidney stone      Past Surgical History:   Procedure Laterality Date   ? NEPHRECTOMY Left      History reviewed. No pertinent family history.    Social History     Socioeconomic History   ? Marital status: Legally Separated     Spouse name: Not on file   ? Number of children: Not on file   ? Years of education: Not on file   ? Highest education level: Not on file   Occupational History   ? Not on file   Social Needs   ? Financial resource strain: Not on file   ? Food insecurity:     Worry: Not on file     Inability: Not on file   ?  Transportation needs:     Medical: Not on file     Non-medical: Not on file   Tobacco Use   ? Smoking status: Current Every Day Smoker     Packs/day: 1.00   ? Smokeless tobacco: Former Estate agent and Sexual Activity   ? Alcohol use: No   ? Drug use: No   ? Sexual activity: Not on file   Lifestyle   ? Physical activity:     Days per week: Not on file     Minutes per session: Not on file   ? Stress: Not on file   Relationships   ? Social connections:     Talks on phone: Not on file     Gets together: Not on file     Attends religious service: Not on file     Active member of club or organization: Not on file     Attends meetings of clubs or organizations: Not on file     Relationship status: Not on file   ? Intimate partner violence:  Fear of current or ex partner: Not on file     Emotionally abused: Not on file     Physically abused: Not on file     Forced sexual activity: Not on file   Other Topics Concern   ? Not on file   Social History Narrative   ? Not on file     Allergies as of 09/21/2018 - Review Complete 09/21/2018   Allergen Reaction Noted   ? Tylenol with codeine #3 [acetaminophen-codeine] Hives 11/08/2012     No current facility-administered medications on file prior to encounter.      Current Outpatient Medications on File Prior to Encounter   Medication Sig Dispense Refill   ? atenolol (TENORMIN) 50 MG tablet Take 50 mg by mouth daily.     ? gabapentin (NEURONTIN) 300 MG capsule Take 1 capsule by mouth 3 (three) times daily 90 capsule 0       ROS  Review of Systems   Constitutional: Negative for fever.   HENT: Negative.    Respiratory: Negative for cough and shortness of breath.    Cardiovascular: Negative for chest pain.   Gastrointestinal: Positive for abdominal pain. Negative for diarrhea and vomiting.   Genitourinary: Negative for difficulty urinating, scrotal swelling and testicular pain.   Musculoskeletal: Positive for back pain (low).   Skin: Negative.    Neurological: Negative for  syncope, weakness and headaches.        Chronic left lateral thigh paresthesias, stable   Psychiatric/Behavioral: Negative for confusion.     Physical Exam  Physical Exam  Vitals signs and nursing note reviewed.   Constitutional:       Appearance: He is well-developed.   HENT:      Head: Atraumatic.   Neck:      Musculoskeletal: Neck supple.   Cardiovascular:      Rate and Rhythm: Normal rate and regular rhythm.      Heart sounds: Normal heart sounds. No murmur. No friction rub. No gallop.       Comments: Intact distal pulses  Pulmonary:      Effort: Pulmonary effort is normal. No respiratory distress.      Breath sounds: Normal breath sounds. No wheezing or rales.   Abdominal:      General: Bowel sounds are normal. There is no distension.      Palpations: Abdomen is soft.      Tenderness: There is abdominal tenderness (mild, mid abd). There is no guarding or rebound.      Hernia: A hernia is present.      Comments: Hernia: mid supraumbilical, reduces with supine position, no overlying erythema, no incarceration/strangulation   Musculoskeletal:         General: No swelling or tenderness.      Comments: No vertebral spine tenderness of the C/T/L/S-spine  Positive tenderness of the left paraspinal/left QL muscle region with mild spasm.  No crepitus.  No overlying erythema.  No sign of infection.   Skin:     Findings: No rash.      Comments: Normal skin color and temperature, no ischemia, no crepitus, no sign of infection   Neurological:      Mental Status: He is alert.      Sensory: Sensation is intact.      Motor: Motor function is intact.      Comments: Straight leg raise is negative bilaterally  Decreased left patellar DTR, otherwise DTRs are unremarkable  Normal speech   Psychiatric:  Behavior: Behavior normal.     Labs Results:  Recent Results (from the past 24 hour(s))   CBC w/Diff    Collection Time: 09/21/18  6:49 PM   Result Value Ref Range    White Blood Count 7.08 4.5 - 11.0 10*3/uL    Red Blood  Count 4.79 4.5 - 5.9 10*6/uL    Hemoglobin 15.3 13.5 - 17.5 g/dL    Hematocrit 16.1 09.6 - 53.0 %    Mean Corpuscular Volume 93.7 80.0 - 100.0 fL    Mean Corpuscular Hemoglobin 31.9 26.0 - 34.0 pg    Mean Corpuscular HGB Conc 34.1 31.0 - 37.0 g/dL    Red Cell Distribution Width-CV 13.9 12.0 - 16.8 %    Platelet Count 143 140 - 440 10*3/uL    Mean Platelet Volume 11.2 (H) 6.7 - 10.8 fL    Diff Type Hospital CBC w/AutoDiff (arb'U)    Neutrophils % 57.0 45 - 80 %    Lymphocyte % 32.8 15 - 50 %    Monocyte % 6.4 0 - 15 %    Eosinophil% 3.4 0 - 7 %    BASO% 0.3 0 - 2 %    Immature Granulocyte% 0.1 (H) 0 %    Nucleated RBC % 0 0 /100(WBC)    Neutrophil Abs 4.04 2.0 - 8.8 10*3/uL    Lymphocyte-Absolute 2.32 0.7 - 5.5 10*3/uL    Monocyte Absolute 0.45 0.0 - 1.7 10*3/uL    EOS-Absolute 0.24 0.0 - 0.8 10*3/uL    Basophil Abs 0.02 0.0 - 0.2 10*3/uL    Immature Granulocyte Abs 0.01 <1 10*3/uL   CMP    Collection Time: 09/21/18  6:49 PM   Result Value Ref Range    Sodium 136 (L) 137 - 145 mmol/L    Potassium 4.0 3.5 - 5.1 mmol/L    Chloride 104 98 - 107 mmol/L    Carbon Dioxide 27 22 - 30 mmol/L    Glucose 107 (H) 74 - 99 mg/dL    Blood Urea Nitrogen (BUN) 15 7 - 20 mg/dL    Creatinine-Blood 1.4 0.7 - 1.5 mg/dL    BUN/Creatinine Ratio 10.7 RATIO    Estimated GFR 52 (L) >60 /1.73 m2    Total Protein 7.3 6.3 - 8.2 g/dL    Albumin 4.2 3.5 - 5.0 g/dL    Globulin 3.1 1.5 - 4.5 g/dL    Albumin/Globulin Ratio 1.4 1.1 - 2.5 RATIO    Calcium 9.6 8.4 - 10.2 mg/dL    Total Bilirubin 0.7 0.2 - 1.3 mg/dL    AST/SGOT 27 15 - 46 U/L    ALT/SGPT 30 0 - 50 U/L    Alkaline Phosphatase 57 38 - 126 U/L   Lipase    Collection Time: 09/21/18  6:49 PM   Result Value Ref Range    Lipase 610 (H) 23 - 300 U/L   Urinalysis    Collection Time: 09/21/18  8:07 PM   Result Value Ref Range    Color-Urine Yellow     Clarity-Urine Clear     Specific Gravity Urine 1.022 1.005 - 1.030 (arb'U)    pH-Urine 5.5 5.0 - 9.0 (pH)    Protein-Urine 10 (A) Negative mg/dL     Glucose-Urine Negative Negative mg/dL    Ketone-Urine Negative Negative mg/dL    Bilirubin-Urine Negative Negative mg/dL    Occult Blood-Urine Negative Negative    Nitrite-Urine Negative Negative    Urobilinogen-Urine 4.0 (A) Normal (EhrlichU)/dL    Leukocyte Esterase-Urine  Negative Negative (arb'U)    Source-Urine Voided     Reflex Microscopic? Macroscopic only performed      Lab Comments:  I ordered the above labs and reviewed the results.    Radiology:  Ct Lumbar Spine Wo Contrast    Result Date: 09/21/2018  RADIOLOGY REPORT FACILITY:  [facility] UNIT/AGE/GENDER: D.EDA  ER      AGE:22 Y          SEX:M PATIENT NAME/DOB:  Christopher Peters, Christopher Peters    07-Oct-1960 UNIT NUMBER:  [id number] ACCOUNT NUMBER:  [id number] ACCESSION NUMBER:  [id number] Examination: CT scan of the lumbar spine with CT reconstructions. DATE: 09/21/2018 HISTORY: Back pain or radiculopathy, > 6 wks initial encounter  COMPARISON: 2015. Technique: Standard helical transaxial CT evaluation of the lumbar spine was performed with sagittal and coronal reconstructed imaging performed. All CT scans at this facility use dose modulation, iterative reconstruction, and/or weight based dosing when  appropriate to reduce radiation dose to as low as reasonably achievable. FINDINGS: Vertebral height and alignment are anatomic. Vascular calcification is advanced relative to age. On axial imaging, L1-2: Negative L2-3: Mild circumferential disc bulge minimally effaces the thecal sac diffusely. L3-4: Mild bilateral facet arthropathy. Disc bulge is eccentric to the far lateral region on the left with mild left foraminal narrowing. L4-5: Negative L5-S1: Moderate bilateral facet arthropathy. 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 Images and Report reviewed and interpreted by: Georgina Peer, M.D. <PS><Electronically signed by: Georgina Peer, M.D.> 09/21/2018 1922 D: 09/21/2018 1917 T: 09/21/2018  1917    Ct Abdomen & Pelvis Wo Iv Contrast Without Oral Contrast    Result Date: 09/21/2018  RADIOLOGY REPORT FACILITY:  [facility] UNIT/AGE/GENDER: D.EDA  ER      AGE:22 Y          SEX:M PATIENT NAME/DOBXAIVIER, Christopher Peters    1961-01-03 UNIT NUMBER:  [id number] ACCOUNT NUMBER:  [id number] ACCESSION NUMBER:  [id number] EXAMINATION: CT ABDOMEN AND PELVIS WO IV CONTRAST DATE: 09/21/2018 COMPARISON: November 08, 2012 INDICATION: 3 yr/omalewho presents with a few day history of left-sided low back pain along with left mid abdominal pain. .    TECHNIQUE:   Helical CT of the abdomen and pelvis was performed without the use of intravenous or oral contrast media. The lack of intravenous and oral contrast limits the sensitivity for soft tissue pathology detection. CT scans at this facility use dose modulation, iterative reconstruction and/or weight based dosing when appropriate to reduce radiation dose to as low as reasonably achievable FINDINGS: Abdomen: Limited evaluation of the lung bases is unremarkable. The gallbladder, liver, spleen, pancreas, and adrenal glands are within normal limits. There has been interval resection of the left kidney since the prior CT scan. Again seen is a cyst emanating from the medial aspect of the right kidney which is otherwise within normal limits. There is no hydronephrosis. There is a midline anterior supraumbilical incisional hernia containing only omental fat. Pelvis: The urinary bladder demonstrates smooth contours with no calculi seen within the bladder lumen. There are no calculi within the distal ureters. There is no sign of a pelvic mass or ascites. The alimentary tract is unremarkable given the limitations of this unenhanced examination. IMPRESSION: No acute abnormalities of the abdomen or pelvis. Dictated by: Judie Petit. Verneita Griffes, M.D. Images and Report reviewed and interpreted by: M. Verneita Griffes, M.D. <PS><Electronically signed by: Judie Petit. Verneita Griffes, M.D.> 09/21/2018  9629 D: 09/21/2018 5284 T: 09/21/2018 1324  Radiology Comments:  I ordered the above imaging and reviewed the results.    Results of the radiology studies reviewed with the patient.  Specifically we discussed abnormalities listed in the findings and the impression including the incidental abnormalities requiring further workup, evaluation, monitoring.  They agree to an understanding.     Medications given in ED:  Medications   Morphine (PF) injection 4 mg (4 mg Intravenous Given 09/21/18 1849)   ondansetron (ZOFRAN) injection 4 mg (4 mg Intravenous Given 09/21/18 1849)   metaxalone (SKELAXIN) tablet 800 mg (800 mg Oral Given 09/21/18 2102)     Progress Notes:  Prior medication history reviewed.  Okay with morphine in the past.    CT scan was performed without contrast given his history of nephrectomy and  low GFR.    We discussed results of the ED work-up and concerns.  Upon recheck abdomen is soft, nontender, nondistended.  He is ambulating in the room.  I did offer admission secondary to the elevated lipase, however the patient is declining admission, and is requesting discharge.  Of note he denies any risk factors for infection of his back, such as procedures, instrumentation, injections, etc.  I spoke with the patient about his ED work up results/abnormalities, diagnosis and the plan for discharge. Instructed to f/up with PCP. I instructed him to return to the Emergency Room for new or worsening symptoms. All of the pt's questions were answered. The patient is stable at time of discharge.     In discharge instructions, pt is instructed to follow up for BP check.        Diagnosis:    ICD-10-CM ICD-9-CM   1. Low back pain, unspecified back pain laterality, unspecified chronicity, unspecified whether sciatica present M54.5 724.2   2. Elevated lipase R74.8 790.5   3. Abnormal CT abdomen         Follow Up:  [facility] physician finder in 3 days for a re check and re check pancrease labs    [place] [place] [facility] in 3  days for recheck of the hernia.    Rx:  Encounter Medications     Signed Prescriptions Disp Refills   ? metaxalone (SKELAXIN) 800 MG tablet 10 tablet 0     Sig: Take 1 tablet by mouth 2 (two) times daily as needed for Pain.     Scribe attestation:  I personally scribed for Michael Litter, MD on 09/21/2018 at 6:30 PM.   Electronically signed by scribe, Carmelina Paddock, at time 9:36 PM.     Provider attestation:  I personally reviewed the past medical history, past surgical history, social history, family history, current medications, and allergies as they appear in the chart.  The scribe's note accurately reflects the work and decisions made by me.    [employer] Payment System  Data Reporting Section    The following information is for internal use only for reporting specific care related data as required by The Medicare Access & Chip Reauthorization Act of 2015.  This data will be made available in aggregate to respective government and regulatory agencies.    CMS #317)  Preventative Care and Screening:  Screening for High Blood Pressure and Follow-Up Documented    * All patient's aged 49 and older who have been screened for high blood pressure and recommended follow-up.    Did the patient have SBP > 120 or DBP > 80 while in the ED?    Yes    For those with elevated  blood pressure, did you recommend follow-up for recheck?    Yes    Provider Note Signed by:    Michael Litter., MD  09/23/18 0900    Electronically signed by Michael Litter., MD at 09/23/2018  9:00 AM EDT

## 2018-09-21 NOTE — ED Notes (Signed)
Formatting of this note might be different from the original.  Pt to CT at this time with transport.  Electronically signed by Orvil Feil., RN at 09/21/2018  6:52 PM EDT

## 2018-09-21 NOTE — ED Notes (Signed)
Formatting of this note might be different from the original.  Pt returned from CT at this time.  Electronically signed by Orvil Feil., RN at 09/21/2018  7:01 PM EDT

## 2018-09-21 NOTE — ED Notes (Signed)
Formatting of this note might be different from the original.  Dr. Elise Benne at bedside.  Electronically signed by Orvil Feil., RN at 09/21/2018  6:26 PM EDT

## 2019-03-31 ENCOUNTER — Emergency Department: Admit: 2019-03-31 | Payer: Medicaid - Out of State | Primary: Family Medicine

## 2019-03-31 ENCOUNTER — Inpatient Hospital Stay
Admit: 2019-03-31 | Discharge: 2019-03-31 | Disposition: A | Discharge status: Home / Self Care | Payer: Medicaid - Out of State

## 2019-03-31 DIAGNOSIS — R109 Unspecified abdominal pain: Secondary | ICD-10-CM

## 2019-03-31 LAB — CBC WITH AUTO DIFFERENTIAL
Basophils %: 0 % (ref 0–1)
Basophils Absolute: 0 10*3/uL (ref 0.0–0.1)
Eosinophils %: 4 % (ref 0–7)
Eosinophils Absolute: 0.3 10*3/uL (ref 0.0–0.4)
Granulocyte Absolute Count: 0 10*3/uL (ref 0.00–0.04)
Hematocrit: 42.3 % (ref 36.6–50.3)
Hemoglobin: 14.5 g/dL (ref 12.1–17.0)
Immature Granulocytes: 0 % (ref 0.0–0.5)
Lymphocytes %: 26 % (ref 12–49)
Lymphocytes Absolute: 1.5 10*3/uL (ref 0.8–3.5)
MCH: 32.6 PG (ref 26.0–34.0)
MCHC: 34.3 g/dL (ref 30.0–36.5)
MCV: 95.1 FL (ref 80.0–99.0)
MPV: 11.7 FL (ref 8.9–12.9)
Monocytes %: 5 % (ref 5–13)
Monocytes Absolute: 0.3 10*3/uL (ref 0.0–1.0)
Neutrophils %: 65 % (ref 32–75)
Neutrophils Absolute: 3.7 10*3/uL (ref 1.8–8.0)
Platelets: 137 10*3/uL — ABNORMAL LOW (ref 150–400)
RBC: 4.45 M/uL (ref 4.10–5.70)
RDW: 13.8 % (ref 11.5–14.5)
WBC: 5.7 10*3/uL (ref 4.1–11.1)

## 2019-03-31 LAB — URINALYSIS W/ REFLEX CULTURE
BACTERIA, URINE: NEGATIVE /hpf
Bacteria: NEGATIVE /[HPF]
Bilirubin, Urine: NEGATIVE
Bilirubin: NEGATIVE
Blood, Urine: NEGATIVE
Blood: NEGATIVE
Glucose, Ur: NEGATIVE mg/dL
Glucose: NEGATIVE mg/dL
Ketone: NEGATIVE mg/dL
Ketones, Urine: NEGATIVE mg/dL
Leukocyte Esterase, Urine: NEGATIVE
Leukocyte Esterase: NEGATIVE
Nitrite, Urine: NEGATIVE
Nitrites: NEGATIVE
Protein, UA: NEGATIVE mg/dL
Protein: NEGATIVE mg/dL
Specific Gravity, UA: 1.023 (ref 1.003–1.030)
Specific gravity: 1.023 (ref 1.003–1.030)
Urobilinogen, UA, POCT: 4 EU/dL — ABNORMAL HIGH (ref 0.1–1.0)
Urobilinogen: 4 EU/dL — ABNORMAL HIGH (ref 0.1–1.0)
pH (UA): 5 (ref 5.0–8.0)
pH, UA: 5 (ref 5.0–8.0)

## 2019-03-31 LAB — BASIC METABOLIC PANEL
Anion Gap: 4 mmol/L — ABNORMAL LOW (ref 5–15)
BUN: 12 mg/dL (ref 6–20)
Bun/Cre Ratio: 9 — ABNORMAL LOW (ref 12–20)
CO2: 30 mmol/L (ref 21–32)
Calcium: 8.8 mg/dL (ref 8.5–10.1)
Chloride: 105 mmol/L (ref 97–108)
Creatinine: 1.32 mg/dL — ABNORMAL HIGH (ref 0.70–1.30)
EGFR IF NonAfrican American: 56 mL/min/{1.73_m2} — ABNORMAL LOW (ref >60)
GFR African American: >60 mL/min/{1.73_m2} (ref >60)
Glucose: 171 mg/dL — ABNORMAL HIGH (ref 65–100)
Potassium: 3.6 mmol/L (ref 3.5–5.1)
Sodium: 139 mmol/L (ref 136–145)

## 2019-03-31 LAB — CBC WITH AUTOMATED DIFF
ABS. BASOPHILS: 0 10*3/uL (ref 0.0–0.1)
ABS. EOSINOPHILS: 0.3 10*3/uL (ref 0.0–0.4)
ABS. IMM. GRANS.: 0 10*3/uL (ref 0.00–0.04)
ABS. LYMPHOCYTES: 1.5 10*3/uL (ref 0.8–3.5)
ABS. MONOCYTES: 0.3 10*3/uL (ref 0.0–1.0)
ABS. NEUTROPHILS: 3.7 10*3/uL (ref 1.8–8.0)
BASOPHILS: 0 % (ref 0–1)
EOSINOPHILS: 4 % (ref 0–7)
HCT: 42.3 % (ref 36.6–50.3)
HGB: 14.5 g/dL (ref 12.1–17.0)
IMMATURE GRANULOCYTES: 0 % (ref 0.0–0.5)
LYMPHOCYTES: 26 % (ref 12–49)
MCH: 32.6 PG (ref 26.0–34.0)
MCHC: 34.3 g/dL (ref 30.0–36.5)
MCV: 95.1 FL (ref 80.0–99.0)
MONOCYTES: 5 % (ref 5–13)
MPV: 11.7 FL (ref 8.9–12.9)
NEUTROPHILS: 65 % (ref 32–75)
PLATELET: 137 10*3/uL — ABNORMAL LOW (ref 150–400)
RBC: 4.45 M/uL (ref 4.10–5.70)
RDW: 13.8 % (ref 11.5–14.5)
WBC: 5.7 10*3/uL (ref 4.1–11.1)

## 2019-03-31 LAB — METABOLIC PANEL, BASIC
Anion gap: 4 mmol/L — ABNORMAL LOW (ref 5–15)
BUN/Creatinine ratio: 9 — ABNORMAL LOW (ref 12–20)
BUN: 12 mg/dL (ref 6–20)
CO2: 30 mmol/L (ref 21–32)
Calcium: 8.8 mg/dL (ref 8.5–10.1)
Chloride: 105 mmol/L (ref 97–108)
Creatinine: 1.32 mg/dL — ABNORMAL HIGH (ref 0.70–1.30)
GFR est AA: >60 mL/min/{1.73_m2} (ref >60)
GFR est non-AA: 56 mL/min/{1.73_m2} — ABNORMAL LOW (ref >60)
Glucose: 171 mg/dL — ABNORMAL HIGH (ref 65–100)
Potassium: 3.6 mmol/L (ref 3.5–5.1)
Sodium: 139 mmol/L (ref 136–145)

## 2019-03-31 MED ORDER — MORPHINE 2 MG/ML INJECTION
2 mg/mL | Freq: Once | INTRAMUSCULAR | Status: AC
Start: 2019-03-31 — End: 2019-03-31
  Administered 2019-03-31: 18:00:00 via INTRAVENOUS

## 2019-03-31 MED ORDER — ONDANSETRON (PF) 4 MG/2 ML INJECTION
4 mg/2 mL | INTRAMUSCULAR | Status: AC
Start: 2019-03-31 — End: 2019-03-31
  Administered 2019-03-31: 18:00:00 via INTRAVENOUS

## 2019-03-31 MED ORDER — ONDANSETRON 4 MG TAB, RAPID DISSOLVE
4 mg | ORAL_TABLET | Freq: Three times a day (TID) | ORAL | 0 refills | Status: AC | PRN
Start: 2019-03-31 — End: 2021-02-25

## 2019-03-31 MED ORDER — OXYCODONE 5 MG TAB
5 mg | ORAL_TABLET | Freq: Four times a day (QID) | ORAL | 0 refills | Status: AC | PRN
Start: 2019-03-31 — End: 2019-04-03

## 2019-03-31 MED ORDER — SODIUM CHLORIDE 0.9 % IV
INTRAVENOUS | Status: DC
Start: 2019-03-31 — End: 2019-03-31
  Administered 2019-03-31: 18:00:00 via INTRAVENOUS

## 2019-03-31 MED ORDER — HYDROMORPHONE 1 MG/ML INJECTION SOLUTION
1 mg/mL | Freq: Once | INTRAMUSCULAR | Status: AC
Start: 2019-03-31 — End: 2019-03-31
  Administered 2019-03-31: 20:00:00 via INTRAVENOUS

## 2019-03-31 MED FILL — MORPHINE 2 MG/ML INJECTION: 2 mg/mL | INTRAMUSCULAR | Qty: 1

## 2019-03-31 MED FILL — HYDROMORPHONE 1 MG/ML INJECTION SOLUTION: 1 mg/mL | INTRAMUSCULAR | Qty: 1

## 2019-03-31 MED FILL — ONDANSETRON (PF) 4 MG/2 ML INJECTION: 4 mg/2 mL | INTRAMUSCULAR | Qty: 2

## 2019-03-31 MED FILL — SODIUM CHLORIDE 0.9 % IV: INTRAVENOUS | Qty: 1000

## 2019-03-31 NOTE — ED Notes (Signed)
Reports he has a kidney stone. Reports R back pain, nausea, and dark urine. 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

## 2019-03-31 NOTE — ED Provider Notes (Signed)
ED Provider Notes by Dondra Prader, NP at 03/31/19 1222                Author: Dondra Prader, NP  Service: Emergency Medicine  Author Type: Nurse Practitioner       Filed: 03/31/19 1436  Date of Service: 03/31/19 1222  Status: Attested           Editor: Dondra Prader, NP (Nurse Practitioner)  Cosigner: Elder Cyphers, MD at 03/31/19 [phone]          Attestation signed by Elder Cyphers, MD at 03/31/19 1522          3:22 PM   I was personally available for consultation in the emergency department.  I have reviewed the chart and agree with the documentation recorded by the APP, including the assessment, treatment plan, and disposition.   Elder Cyphers, MD                                    EMERGENCY DEPARTMENT HISTORY AND PHYSICAL EXAM           Date: 03/31/2019   Patient Name: Christopher Peters           History of Presenting Illness        No chief complaint on file.         History Provided By: Patient      HPI: Christopher Peters,  58 y.o. male with a past medical history significant  hypertension and Kideny stone, left kidney removal 2 years ago presents to the ED with cc of Right flank pain, nausea, burning with urination, and dark urine.  Complaints of pain 8 out of 10, sharp  and aching, constant.  Denies any radiation of pain or anything making it better or worse.  He reports use of BC poweder with no relief. he reports symptoms been present for the past 2 days.  Denies any vomiting, fever, chills, foul odor, frequency, or  blood noted. He reports currently visiting family members originally from [place] managed by PCP and urologist in [place].  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  current smoker 1 pack/day.       There are no other complaints, changes, or physical findings at this time.      PCP: Other, Phys, MD        Current Facility-Administered Medications             Medication   Dose  Route  Frequency  Provider  Last Rate  Last Dose              ?  0.9% sodium chloride infusion   999 mL/hr  IntraVENous  CONTINUOUS  Dondra Prader, NP  999 mL/hr at 03/31/19 1252  999 mL/hr at 03/31/19 1252              ?  HYDROmorphone (DILAUDID) injection 1 mg   1 mg  IntraVENous  ONCE  McFarlin, Abdoulaye Drum N, NP                Current Outpatient Medications          Medication  Sig  Dispense  Refill           ?  oxyCODONE IR (Roxicodone) 5 mg immediate release tablet  Take 1  Tab by mouth every six (6) hours as needed for Pain for up to 3 days. Max Daily Amount: 20 mg.  12 Tab  0           ?  ondansetron (Zofran ODT) 4 mg disintegrating tablet  Take 1 Tab by mouth every eight (8) hours as needed for Nausea.  10 Tab  0             Past History        Past Medical History:   No past medical history on file.      Past Surgical History:   No past surgical history on file.      Family History:   No family history on file.      Social History:     Social History          Tobacco Use         ?  Smoking status:  Not on file       Substance Use Topics         ?  Alcohol use:  Not on file         ?  Drug use:  Not on file           Allergies:     Allergies        Allergen  Reactions         ?  Acetaminophen-Codeine  Other (comments)             "made my skin crawl"         ?  Toradol [Ketorolac]  Other (comments)             States he only has one kidney and med is bad for kidney                Review of Systems        Review of Systems    Constitutional: Negative for chills.    HENT: Negative.     Respiratory: Negative.     Cardiovascular: Negative.     Gastrointestinal: Positive for nausea.    Genitourinary: Positive for dysuria and flank pain . Negative for decreased urine volume, difficulty urinating, hematuria and urgency.    Skin: Negative.     Neurological: Negative for dizziness and light-headedness.    Psychiatric/Behavioral: Negative.             Physical Exam        Physical Exam   Constitutional :         Appearance: Normal appearance. He is normal weight.   HENT :       Head: Normocephalic and atraumatic.      Mouth/Throat:      Mouth: Mucous membranes are moist.    Eyes:       Extraocular Movements: Extraocular movements intact.   Neck :       Musculoskeletal: Normal range of motion and neck supple.   Cardiovascular :       Rate and Rhythm: Regular rhythm.      Pulses: Normal pulses.    Pulmonary:       Effort: Pulmonary effort is normal.      Breath sounds: Normal breath sounds.   Abdominal :      General: Bowel sounds are normal. There is no distension.      Palpations: Abdomen is soft.      Tenderness: There is no abdominal tenderness. There is no right CVA tenderness  or left CVA tenderness.    Skin:      General: Skin is warm and dry.      Capillary Refill: Capillary refill takes less than 2 seconds.    Neurological:       Mental Status: He is alert and oriented to person, place, and time.    Psychiatric:         Mood and Affect: Mood normal.         Behavior: Behavior normal.               Lab and Diagnostic Study Results        Labs -         Recent Results (from the past 12 hour(s))     CBC WITH AUTOMATED DIFF          Collection Time: 03/31/19  1:10 PM         Result  Value  Ref Range            WBC  5.7  4.1 - 11.1 K/uL       RBC  4.45  4.10 - 5.70 M/uL       HGB  14.5  12.1 - 17.0 g/dL       HCT  16.142.3  09.636.6 - 50.3 %       MCV  95.1  80.0 - 99.0 FL       MCH  32.6  26.0 - 34.0 PG       MCHC  34.3  30.0 - 36.5 g/dL       RDW  04.513.8  40.911.5 - 14.5 %       PLATELET  137 (L)  150 - 400 K/uL       MPV  11.7  8.9 - 12.9 FL       NEUTROPHILS  65  32 - 75 %       LYMPHOCYTES  26  12 - 49 %       MONOCYTES  5  5 - 13 %       EOSINOPHILS  4  0 - 7 %       BASOPHILS  0  0 - 1 %       IMMATURE GRANULOCYTES  0  0.0 - 0.5 %       ABS. NEUTROPHILS  3.7  1.8 - 8.0 K/UL       ABS. LYMPHOCYTES  1.5  0.8 - 3.5 K/UL       ABS. MONOCYTES  0.3  0.0 - 1.0 K/UL       ABS. EOSINOPHILS  0.3  0.0 - 0.4 K/UL       ABS. BASOPHILS  0.0   0.0 - 0.1 K/UL       ABS. IMM. GRANS.  0.0  0.00 - 0.04 K/UL       DF  AUTOMATED          METABOLIC PANEL, BASIC          Collection Time: 03/31/19  1:10 PM         Result  Value  Ref Range            Sodium  139  136 - 145 mmol/L       Potassium  3.6  3.5 - 5.1 mmol/L       Chloride  105  97 - 108 mmol/L       CO2  30  21 - 32 mmol/L  Anion gap  4 (L)  5 - 15 mmol/L       Glucose  171 (H)  65 - 100 mg/dL       BUN  12  6 - 20 mg/dL       Creatinine  1.02 (H)  0.70 - 1.30 mg/dL       BUN/Creatinine ratio  9 (L)  12 - 20         GFR est AA  >60  >60 ml/min/1.40m2            GFR est non-AA  56 (L)  >60 ml/min/1.72m2            Calcium  8.8  8.5 - 10.1 mg/dL       URINALYSIS W/ REFLEX CULTURE          Collection Time: 03/31/19  1:24 PM       Specimen: Urine         Result  Value  Ref Range            Color  Amber          Appearance  Clear  Clear         Specific gravity  1.023  1.003 - 1.030         pH (UA)  5.0  5.0 - 8.0         Protein  Negative  Negative mg/dL       Glucose  Negative  Negative mg/dL       Ketone  Negative  Negative mg/dL       Bilirubin  Negative  Negative         Blood  Negative  Negative         Urobilinogen  4.0 (H)  0.1 - 1.0 EU/dL       Nitrites  Negative  Negative         Leukocyte Esterase  Negative  Negative         UA:UC IF INDICATED  Culture not indicated by UA result  Culture not indicated by UA result         WBC  0-4  0 - 4 /hpf       RBC  0-5  0 - 5 /hpf       Bacteria  Negative  Negative /hpf            Mucus  Trace  /lpf           Radiologic Studies -    @LASTXRRESULT @     CT Results   (Last 48 hours)                                    03/31/19 1302    CT ABD PELV WO CONT  Final result            Impression:    IMPRESSION:             Possible punctate nonobstructive right renal stone. Left nephrectomy. No      inflammatory changes about obstruction. Follow-up as clinically indicated.                       Narrative:    CT abdomen and pelvis without contrast             Dose  reduction: All CT scans at this facility are performed using  dose reduction      optimization techniques as appropriate to a performed exam including the      following: Automated exposure control, adjustments of the mA and/or kV according      to patient size, or use of iterative reconstruction technique.             INDICATION: Evaluate for kidney stones             FINDINGS:             No airspace disease in the lung bases. Liver, spleen and pancreas are      unremarkable on this noncontrast study. Gallbladder is somewhat contracted      without pericholecystic stranding. No aneurysm abdominal aorta. Mild      atherosclerosis.             There is a left-sided nephrectomy with multiple clips. Probable punctate      nonobstructive right renal stone. No ureteral stones. Approximately 4 cm right      renal lesion medially may represent a cyst.             No bowel obstruction. Colonic stool and underdistention limit the evaluation for      lesion/mass. Few colonic diverticula without acute diverticulitis. Normal      appendix. No abscess. No free intraperitoneal air. No free fluid in the pelvis.      Umbilical/supraumbilical hernias containing omentum/fat. Small sclerotic focus      left iliac bone may represent bone island.                                 CXR Results   (Last 48 hours)          None                    Medical Decision Making and ED Course     - I am the first and primary provider for this patient.      - I reviewed the vital signs, available nursing notes, past medical history, past surgical history, family history and social history.      - Initial assessment performed. The patients presenting problems have been discussed, and the staff are in agreement with the care plan formulated and outlined with them.  I have encouraged them to ask questions as they arise throughout their visit.      Vital Signs-Reviewed the patient's vital signs.      Patient Vitals for the past 12 hrs:            Temp   Pulse  Resp  BP  SpO2            03/31/19 1204  97.5 ??F (36.4 ??C)  91  17  (!) 159/76  98 %           The patient presents with flank pain with a differential diagnosis of kideny stone, urinary obstruction, hydronephrosis, hematuria      ED Course:                    Provider Notes (Medical Decision Making):    CT noted with probable right stone and 4mm cyst. No obstruction and no hydronephrosis  Patient's kidney function stable GFR 56.  UA negative. No fever noted.  Patient reports improvement with use of  pain medication.  Patient will be discharged with Roxicodone and advised  to supplement in between with Tylenol.  Unable to tolerate NSAIDs due to previous left nephrectomy.  Patient advised to follow-up with his urologist he verbalized understanding.   Patient stable at time of discharge.           Consultations:                Procedures and Critical Care           Performed by: Elisabeth Cara, NP      Elisabeth Cara, NP              Disposition        Disposition: Discharged      DISCHARGE PLAN:   1. There are no discharge medications for this patient.      2.      Follow-up Information               Follow up With  Specialties  Details  Why  Contact Info              kidney stone hotline    Schedule an appointment as soon as possible for a visit   If symptoms worsen  [phone]              [facility]    Schedule an appointment as soon as possible for a visit in 2 days    [address]   [place] [address]   [phone]             3.  Return to ED if worse    4.      Current Discharge Medication List              START taking these medications          Details        oxyCODONE IR (Roxicodone) 5 mg immediate release tablet  Take 1 Tab by mouth every six (6) hours as needed for Pain for up to 3 days. Max Daily Amount: 20 mg.   Qty: 12 Tab, Refills:  0          Associated Diagnoses: Flank pain; Right renal stone               ondansetron (Zofran ODT) 4 mg disintegrating  tablet  Take 1 Tab by mouth every eight (8) hours as needed for Nausea.   Qty: 10 Tab, Refills:  0                           Diagnosis        Clinical Impression:       1.  Flank pain      2.  Right renal mass         3.  Right renal stone            Attestations:      Elisabeth Cara, NP      Please note that this dictation was completed with Dragon, the computer voice recognition software.  Quite often unanticipated grammatical, syntax, homophones, and other interpretive errors are inadvertently  transcribed by the computer software.  Please disregard these errors.  Please excuse any errors that have escaped final proofreading.  Thank you.

## 2019-03-31 NOTE — ED Provider Notes (Signed)
EMERGENCY DEPARTMENT HISTORY AND PHYSICAL EXAM      Date: 03/31/2019  Patient Name: Christopher Peters      History of Presenting Illness     No chief complaint on file.      History Provided By: Patient    HPI: Christopher Peters, 58 y.o. male with a past medical history significant hypertension and Kideny stone, left kidney removal 2 years ago presents to the ED with cc of Right flank pain, nausea, burning with urination, and dark urine.  Complaints of pain 8 out of 10, sharp and aching, constant.  Denies any radiation of pain or anything making it better or worse.  He reports use of BC poweder with no relief. he reports symptoms been present for the past 2 days.  Denies any vomiting, fever, chills, foul odor, frequency, or blood noted. He reports currently visiting family members originally from [place] managed by PCP and urologist in [place].  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 current smoker 1 pack/day.     There are no other complaints, changes, or physical findings at this time.    PCP: Other, Phys, MD    Current Facility-Administered Medications   Medication Dose Route Frequency Provider Last Rate Last Dose   ??? 0.9% sodium chloride infusion  999 mL/hr IntraVENous CONTINUOUS Elisabeth Cara, NP 999 mL/hr at 03/31/19 1252 999 mL/hr at 03/31/19 1252   ??? HYDROmorphone (DILAUDID) injection 1 mg  1 mg IntraVENous ONCE McFarlin, Eileen Stanford, NP         Current Outpatient Medications   Medication Sig Dispense Refill   ??? oxyCODONE IR (Roxicodone) 5 mg immediate release tablet Take 1 Tab by mouth every six (6) hours as needed for Pain for up to 3 days. Max Daily Amount: 20 mg. 12 Tab 0   ??? ondansetron (Zofran ODT) 4 mg disintegrating tablet Take 1 Tab by mouth every eight (8) hours as needed for Nausea. 10 Tab 0       Past History     Past Medical History:  No past medical history on file.    Past Surgical History:   No past surgical history on file.    Family History:  No family history on file.    Social History:  Social History     Tobacco Use   ??? Smoking status: Not on file   Substance Use Topics   ??? Alcohol use: Not on file   ??? Drug use: Not on file       Allergies:  Allergies   Allergen Reactions   ??? Acetaminophen-Codeine Other (comments)     "made my skin crawl"   ??? Toradol [Ketorolac] Other (comments)     States he only has one kidney and med is bad for kidney         Review of Systems     Review of Systems   Constitutional: Negative for chills.   HENT: Negative.    Respiratory: Negative.    Cardiovascular: Negative.    Gastrointestinal: Positive for nausea.   Genitourinary: Positive for dysuria and flank pain. Negative for decreased urine volume, difficulty urinating, hematuria and urgency.   Skin: Negative.    Neurological: Negative for dizziness and light-headedness.   Psychiatric/Behavioral: Negative.        Physical Exam     Physical Exam  Constitutional:       Appearance: Normal appearance. He is  normal weight.   HENT:      Head: Normocephalic and atraumatic.      Mouth/Throat:      Mouth: Mucous membranes are moist.   Eyes:      Extraocular Movements: Extraocular movements intact.   Neck:      Musculoskeletal: Normal range of motion and neck supple.   Cardiovascular:      Rate and Rhythm: Regular rhythm.      Pulses: Normal pulses.   Pulmonary:      Effort: Pulmonary effort is normal.      Breath sounds: Normal breath sounds.   Abdominal:      General: Bowel sounds are normal. There is no distension.      Palpations: Abdomen is soft.      Tenderness: There is no abdominal tenderness. There is no right CVA tenderness or left CVA tenderness.   Skin:     General: Skin is warm and dry.      Capillary Refill: Capillary refill takes less than 2 seconds.   Neurological:      Mental Status: He is alert and oriented to person, place, and time.   Psychiatric:         Mood and Affect: Mood normal.          Behavior: Behavior normal.         Lab and Diagnostic Study Results     Labs -     Recent Results (from the past 12 hour(s))   CBC WITH AUTOMATED DIFF    Collection Time: 03/31/19  1:10 PM   Result Value Ref Range    WBC 5.7 4.1 - 11.1 K/uL    RBC 4.45 4.10 - 5.70 M/uL    HGB 14.5 12.1 - 17.0 g/dL    HCT 42.3 36.6 - 50.3 %    MCV 95.1 80.0 - 99.0 FL    MCH 32.6 26.0 - 34.0 PG    MCHC 34.3 30.0 - 36.5 g/dL    RDW 13.8 11.5 - 14.5 %    PLATELET 137 (L) 150 - 400 K/uL    MPV 11.7 8.9 - 12.9 FL    NEUTROPHILS 65 32 - 75 %    LYMPHOCYTES 26 12 - 49 %    MONOCYTES 5 5 - 13 %    EOSINOPHILS 4 0 - 7 %    BASOPHILS 0 0 - 1 %    IMMATURE GRANULOCYTES 0 0.0 - 0.5 %    ABS. NEUTROPHILS 3.7 1.8 - 8.0 K/UL    ABS. LYMPHOCYTES 1.5 0.8 - 3.5 K/UL    ABS. MONOCYTES 0.3 0.0 - 1.0 K/UL    ABS. EOSINOPHILS 0.3 0.0 - 0.4 K/UL    ABS. BASOPHILS 0.0 0.0 - 0.1 K/UL    ABS. IMM. GRANS. 0.0 0.00 - 0.04 K/UL    DF AUTOMATED     METABOLIC PANEL, BASIC    Collection Time: 03/31/19  1:10 PM   Result Value Ref Range    Sodium 139 136 - 145 mmol/L    Potassium 3.6 3.5 - 5.1 mmol/L    Chloride 105 97 - 108 mmol/L    CO2 30 21 - 32 mmol/L    Anion gap 4 (L) 5 - 15 mmol/L    Glucose 171 (H) 65 - 100 mg/dL    BUN 12 6 - 20 mg/dL    Creatinine 1.32 (H) 0.70 - 1.30 mg/dL    BUN/Creatinine ratio 9 (L) 12 - 20  GFR est AA >60 >60 ml/min/1.40m2    GFR est non-AA 56 (L) >60 ml/min/1.18m2    Calcium 8.8 8.5 - 10.1 mg/dL   URINALYSIS W/ REFLEX CULTURE    Collection Time: 03/31/19  1:24 PM    Specimen: Urine   Result Value Ref Range    Color Amber      Appearance Clear Clear      Specific gravity 1.023 1.003 - 1.030      pH (UA) 5.0 5.0 - 8.0      Protein Negative Negative mg/dL    Glucose Negative Negative mg/dL    Ketone Negative Negative mg/dL    Bilirubin Negative Negative      Blood Negative Negative      Urobilinogen 4.0 (H) 0.1 - 1.0 EU/dL    Nitrites Negative Negative      Leukocyte Esterase Negative Negative       UA:UC IF INDICATED Culture not indicated by UA result Culture not indicated by UA result      WBC 0-4 0 - 4 /hpf    RBC 0-5 0 - 5 /hpf    Bacteria Negative Negative /hpf    Mucus Trace /lpf       Radiologic Studies -   @  CT Results  (Last 48 hours)               03/31/19 1302  CT ABD PELV WO CONT Final result    Impression:  IMPRESSION:       Possible punctate nonobstructive right renal stone. Left nephrectomy. No   inflammatory changes about obstruction. Follow-up as clinically indicated.       Narrative:  CT abdomen and pelvis without contrast       Dose reduction: All CT scans at this facility are performed using dose reduction   optimization techniques as appropriate to a performed exam including the   following: Automated exposure control, adjustments of the mA and/or kV according   to patient size, or use of iterative reconstruction technique.       INDICATION: Evaluate for kidney stones       FINDINGS:       No airspace disease in the lung bases. Liver, spleen and pancreas are   unremarkable on this noncontrast study. Gallbladder is somewhat contracted   without pericholecystic stranding. No aneurysm abdominal aorta. Mild   atherosclerosis.       There is a left-sided nephrectomy with multiple clips. Probable punctate   nonobstructive right renal stone. No ureteral stones. Approximately 4 cm right   renal lesion medially may represent a cyst.       No bowel obstruction. Colonic stool and underdistention limit the evaluation for   lesion/mass. Few colonic diverticula without acute diverticulitis. Normal   appendix. No abscess. No free intraperitoneal air. No free fluid in the pelvis.   Umbilical/supraumbilical hernias containing omentum/fat. Small sclerotic focus   left iliac bone may represent bone island.               CXR Results  (Last 48 hours)    None          Medical Decision Making and ED Course   - I am the first and primary provider for this patient.     - I reviewed the vital signs, available nursing notes, past medical history, past surgical history, family history and social history.    - Initial assessment performed. The patients presenting problems have been discussed, and the staff are in  agreement with the care plan formulated and outlined with them.  I have encouraged them to ask questions as they arise throughout their visit.    Vital Signs-Reviewed the patient's vital signs.    Patient Vitals for the past 12 hrs:   Temp Pulse Resp BP SpO2   03/31/19 1204 97.5 ??F (36.4 ??C) 91 17 (!) 159/76 98 %       The patient presents with flank pain with a differential diagnosis of kideny stone, urinary obstruction, hydronephrosis, hematuria    ED Course:              Provider Notes (Medical Decision Making):   CT noted with probable right stone and 4mm cyst. No obstruction and no hydronephrosis  Patient's kidney function stable GFR 56.  UA negative. No fever noted.  Patient reports improvement with use of pain medication.  Patient will be discharged with Roxicodone and advised to supplement in between with Tylenol.  Unable to tolerate NSAIDs due to previous left nephrectomy.  Patient advised to follow-up with his urologist he verbalized understanding.  Patient stable at time of discharge.      Consultations:         Procedures and Critical Care       Performed by: Elisabeth CaraAlysha N McFarlin, NP    Elisabeth CaraAlysha N McFarlin, NP        Disposition     Disposition: Discharged    DISCHARGE PLAN:  1. There are no discharge medications for this patient.    2.   Follow-up Information     Follow up With Specialties Details Why Contact Info    kidney stone hotline  Schedule an appointment as soon as possible for a visit  If symptoms worsen [phone]    [facility]  Schedule an appointment as soon as possible for a visit in 2 days  [address]  [place] [id number]  [phone]        3.  Return to ED if worse   4.   Current Discharge Medication List       START taking these medications    Details   oxyCODONE IR (Roxicodone) 5 mg immediate release tablet Take 1 Tab by mouth every six (6) hours as needed for Pain for up to 3 days. Max Daily Amount: 20 mg.  Qty: 12 Tab, Refills: 0    Associated Diagnoses: Flank pain; Right renal stone      ondansetron (Zofran ODT) 4 mg disintegrating tablet Take 1 Tab by mouth every eight (8) hours as needed for Nausea.  Qty: 10 Tab, Refills: 0             Diagnosis     Clinical Impression:   1. Flank pain    2. Right renal mass    3. Right renal stone        Attestations:    Elisabeth CaraAlysha N McFarlin, NP    Please note that this dictation was completed with Dragon, the computer voice recognition software.  Quite often unanticipated grammatical, syntax, homophones, and other interpretive errors are inadvertently transcribed by the computer software.  Please disregard these errors.  Please excuse any errors that have escaped final proofreading.  Thank you.

## 2019-03-31 NOTE — ED Triage Notes (Signed)
Reports he has a kidney stone. Reports R back pain, nausea, and dark urine. 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

## 2019-06-04 ENCOUNTER — Other Ambulatory Visit: Payer: Self-pay

## 2019-06-04 ENCOUNTER — Encounter (HOSPITAL_BASED_OUTPATIENT_CLINIC_OR_DEPARTMENT_OTHER): Payer: Self-pay

## 2019-06-04 ENCOUNTER — Emergency Department (HOSPITAL_BASED_OUTPATIENT_CLINIC_OR_DEPARTMENT_OTHER)
Admission: EM | Admit: 2019-06-04 | Discharge: 2019-06-04 | Disposition: A | Discharge status: Home / Self Care | Payer: Medicaid - Out of State | Attending: Emergency Medicine | Admitting: Emergency Medicine

## 2019-06-04 ENCOUNTER — Emergency Department (HOSPITAL_BASED_OUTPATIENT_CLINIC_OR_DEPARTMENT_OTHER): Payer: Medicaid - Out of State

## 2019-06-04 DIAGNOSIS — Z905 Acquired absence of kidney: Secondary | ICD-10-CM | POA: Insufficient documentation

## 2019-06-04 DIAGNOSIS — R1031 Right lower quadrant pain: Secondary | ICD-10-CM | POA: Insufficient documentation

## 2019-06-04 DIAGNOSIS — Z87442 Personal history of urinary calculi: Secondary | ICD-10-CM | POA: Diagnosis not present

## 2019-06-04 DIAGNOSIS — Z79899 Other long term (current) drug therapy: Secondary | ICD-10-CM | POA: Diagnosis not present

## 2019-06-04 DIAGNOSIS — R109 Unspecified abdominal pain: Secondary | ICD-10-CM

## 2019-06-04 DIAGNOSIS — I10 Essential (primary) hypertension: Secondary | ICD-10-CM | POA: Diagnosis not present

## 2019-06-04 LAB — CBC WITH DIFFERENTIAL/PLATELET
Abs Immature Granulocytes: 0.03 10*3/uL (ref 0.00–0.07)
Basophils Absolute: 0.1 10*3/uL (ref 0.0–0.1)
Basophils Relative: 1 %
Eosinophils Absolute: 0.3 10*3/uL (ref 0.0–0.5)
Eosinophils Relative: 4 %
HCT: 48.2 % (ref 39.0–52.0)
Hemoglobin: 16.1 g/dL (ref 13.0–17.0)
Immature Granulocytes: 0 %
Lymphocytes Relative: 29 %
Lymphs Abs: 2.5 10*3/uL (ref 0.7–4.0)
MCH: 31.8 pg (ref 26.0–34.0)
MCHC: 33.4 g/dL (ref 30.0–36.0)
MCV: 95.3 fL (ref 80.0–100.0)
Monocytes Absolute: 0.6 10*3/uL (ref 0.1–1.0)
Monocytes Relative: 7 %
Neutro Abs: 5 10*3/uL (ref 1.7–7.7)
Neutrophils Relative %: 59 %
Platelets: 160 10*3/uL (ref 150–400)
RBC: 5.06 MIL/uL (ref 4.22–5.81)
RDW: 12.8 % (ref 11.5–15.5)
WBC: 8.4 10*3/uL (ref 4.0–10.5)
nRBC: 0 % (ref 0.0–0.2)

## 2019-06-04 LAB — URINALYSIS, ROUTINE W REFLEX MICROSCOPIC
Bilirubin Urine: NEGATIVE
Glucose, UA: NEGATIVE mg/dL
Hgb urine dipstick: NEGATIVE
Ketones, ur: NEGATIVE mg/dL
Leukocytes,Ua: NEGATIVE
Nitrite: NEGATIVE
Protein, ur: NEGATIVE mg/dL
Specific Gravity, Urine: >1.03 — ABNORMAL HIGH (ref 1.005–1.030)
pH: 6 (ref 5.0–8.0)

## 2019-06-04 LAB — COMPREHENSIVE METABOLIC PANEL
ALT: 24 U/L (ref 0–44)
AST: 19 U/L (ref 15–41)
Albumin: 4.3 g/dL (ref 3.5–5.0)
Alkaline Phosphatase: 62 U/L (ref 38–126)
Anion gap: 11 (ref 5–15)
BUN: 16 mg/dL (ref 6–20)
CO2: 26 mmol/L (ref 22–32)
Calcium: 9.1 mg/dL (ref 8.9–10.3)
Chloride: 102 mmol/L (ref 98–111)
Creatinine, Ser: 1.45 mg/dL — ABNORMAL HIGH (ref 0.61–1.24)
GFR calc Af Amer: >60 mL/min (ref >60)
GFR calc non Af Amer: 53 mL/min — ABNORMAL LOW (ref >60)
Glucose, Bld: 75 mg/dL (ref 70–99)
Potassium: 4 mmol/L (ref 3.5–5.1)
Sodium: 139 mmol/L (ref 135–145)
Total Bilirubin: 0.9 mg/dL (ref 0.3–1.2)
Total Protein: 7.2 g/dL (ref 6.5–8.1)

## 2019-06-04 LAB — LIPASE, BLOOD: Lipase: 40 U/L (ref 11–51)

## 2019-06-04 MED ORDER — ONDANSETRON HCL 4 MG/2ML IJ SOLN
4.0000 mg | Freq: Once | INTRAMUSCULAR | Status: AC
  Administered 2019-06-04: 4 mg via INTRAVENOUS
  Filled 2019-06-04: qty 2

## 2019-06-04 MED ORDER — CYCLOBENZAPRINE HCL 10 MG PO TABS: 10.0000 mg | ORAL_TABLET | Freq: Two times a day (BID) | ORAL | 0 refills | Status: AC | PRN

## 2019-06-04 MED ORDER — FENTANYL CITRATE (PF) 100 MCG/2ML IJ SOLN
100.0000 ug | Freq: Once | INTRAMUSCULAR | Status: AC
  Administered 2019-06-04: 100 ug via INTRAVENOUS
  Filled 2019-06-04: qty 2

## 2019-06-04 MED ORDER — FENTANYL CITRATE (PF) 100 MCG/2ML IJ SOLN
50.0000 ug | INTRAMUSCULAR | Status: DC | PRN
  Administered 2019-06-04: 50 ug via INTRAVENOUS
  Filled 2019-06-04: qty 2

## 2019-06-04 MED FILL — CYCLOBENZAPRINE HCL 10 MG T: 10 | 10 days supply | Qty: 20 | Fill #0

## 2019-06-04 NOTE — ED Notes (Signed)
Pt. Reports he lost his L kidney due to kidney stones.  Pt. Reports he is from [place] and today riding to [place] with his brother he felt his back begin to hurt severely and and his urine has started to become dark.  Pt. Reports he has had multiple kidney stones and feels he has one now.

## 2019-06-04 NOTE — Discharge Instructions (Addendum)
1.  Schedule a follow-up appointment with your primary care doctor as soon as possible.  2.  Your CT scan does not show any kidney stones or other immediately abnormal findings.  Urinalysis does not show signs of infection and your routine blood counts are normal. 3.  You have had recurrent episodes of similar pain.  Sometimes this is kidney stones but at other times is no clear cause is identified.  Your pain may be due to musculoskeletal pain and muscular strain.  Take extra strength Tylenol every 6 hours as needed and Flexeril as prescribed as a muscle relaxer.  You may apply heating pads or ice depending which feels better to you.

## 2019-06-04 NOTE — ED Notes (Signed)
Pt. Brother brought him to ED and he will call him to pick him up

## 2019-06-04 NOTE — ED Notes (Signed)
Pt verbalized understanding of dc instructions.

## 2019-06-04 NOTE — ED Provider Notes (Signed)
[facility] EMERGENCY DEPARTMENT Provider Note   CSN: [id number] Arrival date & time: 06/04/19  1408     History Chief Complaint  Patient presents with   Flank Pain    Christopher Archer is a 59 y.o. male.  HPI Patient reports severe right flank pain.  He states has been there for about a week and a half.  Patient reports feels similar to prior kidney stones.  He reports urine has appeared slightly dark.  No fevers or chills.  No vomiting.  Some cough.  No shortness of breath no chest pain.  Patient has history of left nephrectomy.    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   Past Surgical History:  Procedure Laterality Date   KIDNEY CYST REMOVAL     NEPHRECTOMY         No family history on file.  Social History   Tobacco Use   Smoking status: Current Every Day Smoker   Smokeless tobacco: Never Used  Substance Use Topics   Alcohol use: Never   Drug use: Never    Home Medications Prior to Admission medications   Medication Sig Start Date End Date Taking? Authorizing Provider  albuterol (VENTOLIN HFA) 108 (90 Base) MCG/ACT inhaler 1-2 inhalations every 4-6 hours as needed for wheezing. Dispense spacer as needed. 04/27/18 04/27/19  [provider]  atenolol (TENORMIN) 50 MG tablet Take by mouth.    [provider]  clonazePAM (KLONOPIN) 0.5 MG tablet Take by mouth.    [provider]  cyclobenzaprine (FLEXERIL) 10 MG tablet Take 1 tablet (10 mg total) by mouth 2 (two) times daily as needed for muscle spasms. 06/04/19   Charlesetta Shanks, MD  Ergocalciferol POWD Take by mouth.    [provider]  omeprazole (PRILOSEC) 20 MG capsule Take by mouth. 09/16/16   [provider]  ondansetron (ZOFRAN ODT) 4 MG disintegrating tablet Take 1 tablet (4 mg total) by mouth every 8 (eight) hours as  needed for nausea. 06/17/18   Larene Pickett, PA-C  oxyCODONE-acetaminophen (PERCOCET) 5-325 MG tablet Take 1 tablet by mouth every 4 (four) hours as needed. 06/17/18   Larene Pickett, PA-C    Allergies    Ketorolac and Codeine  Review of Systems   Review of Systems 10 Systems reviewed and are negative for acute change except as noted in the HPI.  Physical Exam Updated Vital Signs BP 122/70    Pulse 65    Temp 98.9 F (37.2 C) (Oral)    Resp 18    Ht 5\' 9"  (1.753 m)    Wt 124.7 kg    SpO2 96%    BMI 40.61 kg/m   Physical Exam Constitutional:      Comments: Alert and nontoxic.  Appears uncomfortable.  Mental status clear.  No respiratory distress.  HENT:     Head: Normocephalic and atraumatic.  Cardiovascular:     Rate and Rhythm: Normal rate and regular rhythm.  Pulmonary:     Effort: Pulmonary effort is normal.     Breath sounds: Normal breath sounds.  Abdominal:     Comments: Abdomen is soft.  Some reproducible pain far right lateral abdominal wall.  No guarding.  Musculoskeletal:        General: No swelling or tenderness. Normal range of motion.  Comments: No edema of the lower extremities calves nontender.  Skin:    General: Skin is warm and dry.  Neurological:     General: No focal deficit present.     Mental Status: He is oriented to person, place, and time.     Coordination: Coordination normal.  Psychiatric:        Mood and Affect: Mood normal.     ED Results / Procedures / Treatments   Labs (all labs ordered are listed, but only abnormal results are displayed) Labs Reviewed  URINALYSIS, ROUTINE W REFLEX MICROSCOPIC - Abnormal; Notable for the following components:      Result Value   Color, Urine AMBER (*)    Specific Gravity, Urine >1.030 (*)    All other components within normal limits  COMPREHENSIVE METABOLIC PANEL - Abnormal; Notable for the following components:   Creatinine, Ser 1.45 (*)    GFR calc non Af Amer 53 (*)    All other components  within normal limits  LIPASE, BLOOD  CBC WITH DIFFERENTIAL/PLATELET    EKG None  Radiology CT Renal Stone Study  Result Date: 06/04/2019 CLINICAL DATA:  Right flank pain. EXAM: CT ABDOMEN AND PELVIS WITHOUT CONTRAST TECHNIQUE: Multidetector CT imaging of the abdomen and pelvis was performed following the standard protocol without IV contrast. COMPARISON:  CT scan dated 11/16/2018 FINDINGS: Lower chest: Normal. Hepatobiliary: No focal liver abnormality is seen. No gallstones, gallbladder wall thickening, or biliary dilatation. Pancreas: The pancreas appears normal. The description of nodules adjacent to the pancreatic head on the prior study is again noted but this appears to be benign lobulation of the pancreatic head rather than adenopathy. There has been no change since the prior study. Spleen: Normal in size without focal abnormality. Adrenals/Urinary Tract: Normal adrenal glands. Benign stable 3.8 cm cyst on the medial aspect of right kidney no renal calculi. No hydronephrosis. Minimal chronic soft tissue stranding in the perinephric space. Previous left nephrectomy. Bladder is normal. Stomach/Bowel: Stomach is within normal limits. Appendix appears normal. No evidence of bowel wall thickening, distention, or inflammatory changes. Vascular/Lymphatic: Aortic atherosclerosis. No enlarged abdominal or pelvic lymph nodes. Reproductive: Prostate is unremarkable. Other: There is a small midline periumbilical hernia containing only fat minimally more prominent than on the prior study. The abdominal wall defect is approximately 4.6 cm. Musculoskeletal: No acute or significant osseous findings. IMPRESSION: 1. No acute abnormality of the abdomen or pelvis. 2. Slight increase in the size of the small midline periumbilical hernia containing only fat. 3. Aortic Atherosclerosis (ICD10-I70.0). Electronically Signed   By: Francene Boyers M.D.   On: 06/04/2019 16:11    Procedures Procedures (including critical care  time)  Medications Ordered in ED Medications  fentaNYL (SUBLIMAZE) injection 50 mcg (50 mcg Intravenous Given 06/04/19 1424)  ondansetron (ZOFRAN) injection 4 mg (4 mg Intravenous Given 06/04/19 1424)  fentaNYL (SUBLIMAZE) injection 100 mcg (100 mcg Intravenous Given 06/04/19 1626)    ED Course  I have reviewed the triage vital signs and the nursing notes.  Pertinent labs & imaging results that were available during my care of the patient were reviewed by me and considered in my medical decision making (see chart for details).    MDM Rules/Calculators/A&P                      Patient has had problems with recurrent right flank pain.  He reports history of frequent kidney stones.  Urinalysis is negative.  No signs of infection.  No hematuria.  CT scan does not identify any acute anomalies.  Patient has had prior similar presentations without consistently identified kidney stones.  This may represent musculoskeletal etiology.  Will prescribe for muscle relaxer and recommend continued use of Tylenol.  Patient reports that he has been discouraged from using NSAIDs due to the fact that he has 1 kidney.  Patient is otherwise clinically well and stable for follow-up with his primary provider. Final Clinical Impression(s) / ED Diagnoses Final diagnoses:  Flank pain    Rx / DC Orders ED Discharge Orders         Ordered    cyclobenzaprine (FLEXERIL) 10 MG tablet  2 times daily PRN     06/04/19 1636           Arby Barrette, MD 06/04/19 1636

## 2019-06-04 NOTE — ED Triage Notes (Signed)
Pt c/o R flank pain that started a week ago and has been intermittent. Pt has hx of frequent kidney stones. Pt states the pain feels similar. Associated difficulty with starting urine stream.

## 2019-11-17 ENCOUNTER — Emergency Department: Admit: 2019-11-17 | Payer: MEDICAID

## 2019-11-17 ENCOUNTER — Inpatient Hospital Stay: Admit: 2019-11-17 | Discharge: 2019-11-17 | Disposition: A | Discharge status: Home / Self Care | Payer: MEDICAID

## 2019-11-17 DIAGNOSIS — N281 Cyst of kidney, acquired: Secondary | ICD-10-CM

## 2019-11-17 LAB — URINALYSIS WITH REFLEX TO CULTURE
Blood, Urine: NEGATIVE
Glucose, Ur: NEGATIVE mg/dL
Leukocyte Esterase, Urine: NEGATIVE
Nitrite, Urine: NEGATIVE
Protein, UA: NEGATIVE mg/dL
Specific Gravity, UA: 1.023 (ref 1.005–1.030)
Urobilinogen, Urine: 1 E.U./dL (ref <2.0)
pH, UA: 5.5 (ref 5.0–8.0)

## 2019-11-17 LAB — CBC WITH AUTO DIFFERENTIAL
Basophils %: 1 %
Basophils Absolute: 0.1 10*3/uL (ref 0.0–0.2)
Eosinophils %: 3.6 %
Eosinophils Absolute: 0.3 10*3/uL (ref 0.0–0.6)
Hematocrit: 48.4 % (ref 40.5–52.5)
Hemoglobin: 16.3 g/dL (ref 13.5–17.5)
Lymphocytes %: 25.6 %
Lymphocytes Absolute: 2.1 10*3/uL (ref 1.0–5.1)
MCH: 32 pg (ref 26.0–34.0)
MCHC: 33.7 g/dL (ref 31.0–36.0)
MCV: 95 fL (ref 80.0–100.0)
MPV: 9.1 fL (ref 5.0–10.5)
Monocytes %: 7.5 %
Monocytes Absolute: 0.6 10*3/uL (ref 0.0–1.3)
Neutrophils %: 62.3 %
Neutrophils Absolute: 5 10*3/uL (ref 1.7–7.7)
Platelets: 160 10*3/uL (ref 135–450)
RBC: 5.1 M/uL (ref 4.20–5.90)
RDW: 14.3 % (ref 12.4–15.4)
WBC: 8.1 10*3/uL (ref 4.0–11.0)

## 2019-11-17 LAB — COMPREHENSIVE METABOLIC PANEL
ALT: 25 U/L (ref 10–40)
AST: 24 U/L (ref 15–37)
Albumin/Globulin Ratio: 1.5 (ref 1.1–2.2)
Albumin: 4.6 g/dL (ref 3.4–5.0)
Alkaline Phosphatase: 71 U/L (ref 40–129)
Anion Gap: 11 (ref 3–16)
BUN: 16 mg/dL (ref 7–20)
CO2: 23 mmol/L (ref 21–32)
Calcium: 9.7 mg/dL (ref 8.3–10.6)
Chloride: 103 mmol/L (ref 99–110)
Creatinine: 1.1 mg/dL (ref 0.9–1.3)
GFR African American: >60 (ref >60)
GFR Non-African American: >60 (ref >60)
Globulin: 3 g/dL
Glucose: 83 mg/dL (ref 70–99)
Potassium: 4.2 mmol/L (ref 3.5–5.1)
Sodium: 137 mmol/L (ref 136–145)
Total Bilirubin: 0.8 mg/dL (ref 0.0–1.0)
Total Protein: 7.6 g/dL (ref 6.4–8.2)

## 2019-11-17 MED ORDER — ACETAMINOPHEN 500 MG PO TABS
500 MG | ORAL_TABLET | Freq: Four times a day (QID) | ORAL | 0 refills | Status: AC | PRN
Start: 2019-11-17 — End: ?

## 2019-11-17 MED ORDER — SODIUM CHLORIDE 0.9 % IV BOLUS
0.9 % | Freq: Once | INTRAVENOUS | Status: AC
Start: 2019-11-17 — End: 2019-11-17
  Administered 2019-11-17: 22:00:00 1000 mL via INTRAVENOUS

## 2019-11-17 MED ORDER — ONDANSETRON HCL 4 MG/2ML IJ SOLN
4 MG/2ML | Freq: Once | INTRAMUSCULAR | Status: AC
Start: 2019-11-17 — End: 2019-11-17
  Administered 2019-11-17: 22:00:00 4 mg via INTRAVENOUS

## 2019-11-17 MED ORDER — MORPHINE SULFATE 4 MG/ML IJ SOLN
4 MG/ML | Freq: Once | INTRAMUSCULAR | Status: AC
Start: 2019-11-17 — End: 2019-11-17
  Administered 2019-11-17: 22:00:00 4 mg via INTRAVENOUS

## 2019-11-17 MED ORDER — ONDANSETRON 4 MG PO TBDP
4 MG | ORAL_TABLET | Freq: Two times a day (BID) | ORAL | 0 refills | Status: AC | PRN
Start: 2019-11-17 — End: ?

## 2019-11-17 MED FILL — ONDANSETRON HCL 4 MG/2ML IJ SOLN: 4 MG/2ML | INTRAMUSCULAR | Qty: 2

## 2019-11-17 MED FILL — MORPHINE SULFATE 4 MG/ML IJ SOLN: 4 mg/mL | INTRAMUSCULAR | Qty: 1

## 2019-11-17 NOTE — ED Notes (Signed)
Pt discharged with belongings, IV removed without complications, Pt aware that he needs to follow-up with his PCP, urology and nephrology about his flank pain. Medications explained to him and prescriptions given. Pt has no further questions at this time. Lillia Carmel, RN       Lillia Carmel, [place]  11/17/19 [phone]

## 2019-11-17 NOTE — ED Provider Notes (Signed)
[facility]        Pt Name: Christopher Peters  MRN: [id number]  Christopher Peters January 20, 1961  Date of evaluation: 11/17/2019  Provider: Eleanora Neighbor, PA-C  PCP: No primary care provider on file.  Note Started: 4:53 PM EDT       APP. I have evaluated this patient.  My supervising physician was available for consultation.    CHIEF COMPLAINT       Chief Complaint   Patient presents with   ??? Flank Pain     R sided kidney pain for a few days, only has 1 kidnet, dark urine       HISTORY OF PRESENT ILLNESS   (Location, Timing/Onset, Context/Setting, Quality, Duration, Modifying Factors, Severity, Associated Signs and Symptoms)  Note limiting factors.     Chief Complaint: Right flank pain    Christopher Peters is a 59 y.o. male who presents to the emergency department complaining of right flank pain and mild nausea for 2 days.  Has history of kidney stone.  He had such severe kidney stones on the left kidney that he had a left nephrectomy.  He reports that his urine is dark.  Denies vomiting, diarrhea, constipation, fever, chills.    Nursing Notes were all reviewed and agreed with or any disagreements were addressed in the HPI.    REVIEW OF SYSTEMS    (2-9 systems for level 4, 10 or more for level 5)     Review of Systems   Constitutional: Negative for chills and fever.   HENT: Negative.    Eyes: Negative for visual disturbance.   Respiratory: Negative for cough, shortness of breath, wheezing and stridor.    Cardiovascular: Negative for chest pain, palpitations and leg swelling.   Gastrointestinal: Positive for nausea. Negative for abdominal distention, abdominal pain, constipation, diarrhea and vomiting.   Endocrine: Negative for polydipsia, polyphagia and polyuria.   Genitourinary: Positive for flank pain. Negative for discharge, dysuria, frequency, hematuria, penile pain, penile swelling, scrotal swelling, testicular pain and urgency.    Musculoskeletal: Negative for back pain, neck pain and neck stiffness.   Skin: Negative for color change, pallor, rash and wound.   Neurological: Negative for dizziness, tremors, seizures, syncope, facial asymmetry, speech difficulty, weakness, light-headedness, numbness and headaches.   Psychiatric/Behavioral: Negative for confusion.   All other systems reviewed and are negative.      Positives and Pertinent negatives as per HPI. Except as noted above in the ROS, all other systems were reviewed and negative.       PAST MEDICAL HISTORY   History reviewed. No pertinent past medical history.      SURGICAL HISTORY   History reviewed. No pertinent surgical history.      CURRENTMEDICATIONS       Previous Medications    No medications on file         ALLERGIES     Patient has no known allergies.    FAMILYHISTORY     History reviewed. No pertinent family history.       SOCIAL HISTORY       Social History     Tobacco Use   ??? Smoking status: Current Every Day Smoker   ??? Smokeless tobacco: Never Used   Substance Use Topics   ??? Alcohol use: Never   ??? Drug use: Never       SCREENINGS    Glasgow Coma Scale  Eye Opening: Spontaneous  Best Verbal Response: Oriented  Best  Motor Response: Obeys commands  Glasgow Coma Scale Score: 15        PHYSICAL EXAM    (up to 7 for level 4, 8 or more for level 5)     ED Triage Vitals [11/17/19 1625]   BP Temp Temp src Pulse Resp SpO2 Height Weight   (!) 117/52 98.5 ??F (36.9 ??C) -- 74 16 97 % -- --       Physical Exam  Vitals and nursing note reviewed.   Constitutional:       Appearance: Normal appearance. He is well-developed. He is obese. He is not toxic-appearing or diaphoretic.   HENT:      Head: Normocephalic and atraumatic.      Right Ear: External ear normal.      Left Ear: External ear normal.      Nose: Nose normal.      Mouth/Throat:      Mouth: Mucous membranes are moist.      Pharynx: Oropharynx is clear.   Eyes:      General: No scleral icterus.        Right eye: No discharge.          Left eye: No discharge.      Extraocular Movements: Extraocular movements intact.      Conjunctiva/sclera: Conjunctivae normal.      Pupils: Pupils are equal, round, and reactive to light.   Cardiovascular:      Rate and Rhythm: Normal rate.   Pulmonary:      Effort: Pulmonary effort is normal.      Breath sounds: Normal breath sounds.   Abdominal:      General: Bowel sounds are normal. There is no abdominal bruit.      Palpations: Abdomen is soft. There is no pulsatile mass.      Tenderness: There is no abdominal tenderness. There is right CVA tenderness. There is no left CVA tenderness, guarding or rebound. Negative signs include Murphy's sign, Rovsing's sign, McBurney's sign, psoas sign and obturator sign.   Musculoskeletal:         General: Normal range of motion.      Cervical back: Normal range of motion.   Skin:     General: Skin is warm and dry.      Capillary Refill: Capillary refill takes less than 2 seconds.      Coloration: Skin is not jaundiced or pale.      Findings: No bruising, erythema, lesion or rash.   Neurological:      General: No focal deficit present.      Mental Status: He is alert and oriented to person, place, and time.   Psychiatric:         Mood and Affect: Mood normal.         Behavior: Behavior normal.         DIAGNOSTIC RESULTS   LABS:    Labs Reviewed   URINE RT REFLEX TO CULTURE - Abnormal; Notable for the following components:       Result Value    Bilirubin Urine SMALL (*)     Ketones, Urine TRACE (*)     All other components within normal limits    Narrative:     Performed at:  [facility] [facility]  [address],  [place], [place] [id number]   Phone [phone]   CBC WITH AUTO DIFFERENTIAL    Narrative:     Performed at:  [facility]  [address]  [place], [place] [address]   Phone [phone]   COMPREHENSIVE METABOLIC PANEL    Narrative:     Performed at:  [facility]  [address],   [place], [place] [address]   Phone [phone]       All other labs were within normal range or not returned as of this dictation.    EKG: All EKG's are interpreted by the Emergency Department Physician in the absence of a cardiologist.  Please see their note for interpretation of EKG.    RADIOLOGY:   Non-plain film images such as CT, Ultrasound and MRI are read by the radiologist. Plain radiographic images are visualized and preliminarily interpreted by the ED Provider with the below findings:        Interpretation per the Radiologist below, if available at the time of this note:    CT ABDOMEN PELVIS WO CONTRAST Additional Contrast? None   Final Result   Unremarkable unenhanced CT examination of the abdomen and pelvis, without   finding to account for the patient's symptoms.      The left kidney is surgically absent.  No right renal calculi or   hydronephrosis.               Narrative:    EXAMINATION:   CT OF THE ABDOMEN AND PELVIS WITHOUT CONTRAST 11/17/2019 4:05 pm     TECHNIQUE:   CT of the abdomen and pelvis was performed without the administration of   intravenous contrast. Multiplanar reformatted images are provided for review.   Dose modulation, iterative reconstruction, and/or weight based adjustment of   the mA/kV was utilized to reduce the radiation dose to as low as reasonably   achievable.     COMPARISON:   None.     HISTORY:   ORDERING SYSTEM PROVIDED HISTORY: right flank pain   TECHNOLOGIST PROVIDED HISTORY:   Reason for exam:->right flank pain   Additional Contrast?->None   Decision Support Exception - unselect if not a suspected or confirmed   emergency medical condition->Emergency Medical Condition (MA)   Reason for Exam: Flank Pain (R sided kidney pain for a few days, only has 1   kidnet, dark urine)     FINDINGS:   Lower Chest: Heart size is normal. ??The visualized lung bases are clear.     Organs: ??The liver, spleen, pancreas, gallbladder, and adrenal glands have an   unremarkable unenhanced CT  appearance. The left kidney is surgically absent.   A benign 4.7 cm right upper pole renal cortical cyst is present. ??There are   no renal, ureteral, or bladder calculi. ??No hydronephrosis or hydroureter.     GI/Bowel: ??The stomach and the small and large bowel loops are normal in   caliber, contour, and morphology, without acute or significant abnormality.   No dilated loops or areas of bowel wall thickening. The appendix is normal.     Peritoneum/Retroperitoneum: There is no free fluid or extraluminal gas. ??No   enlarged or suspicious mesenteric or retroperitoneal lymphadenopathy. The   abdominal aorta and iliac arteries are normal in caliber.     Pelvis: No pelvic free fluid or enlarged or suspicious pelvic or inguinal   lymphadenopathy. ??The prostate gland is not enlarged. The urinary bladder and   the pelvic bowel loops are unremarkable.     Bones/Soft Tissues: Degenerative changes are present throughout the lumbar   spine. No acute fracture. Small fat containing supraumbilical ventral   abdominal wall  hernia.             ??           PROCEDURES   Unless otherwise noted below, none     Procedures    CRITICAL CARE TIME   N/A    CONSULTS:  None      EMERGENCY DEPARTMENT COURSE and DIFFERENTIAL DIAGNOSIS/MDM:   Vitals:    Vitals:    11/17/19 1625 11/17/19 1729 11/17/19 1730 11/17/19 1745   BP: (!) 117/52 (!) 154/82 (!) 146/95 139/82   Pulse: 74 67     Resp: 16 16     Temp: 98.5 ??F (36.9 ??C) 96.9 ??F (36.1 ??C)     TempSrc:  Infrared     SpO2: 97% 96% 97% 96%       Patient was given the following medications:  Medications   0.9 % sodium chloride bolus (1,000 mLs Intravenous New Bag 11/17/19 1755)   morphine injection 4 mg (4 mg Intravenous Given 11/17/19 1745)   ondansetron (ZOFRAN) injection 4 mg (4 mg Intravenous Given 11/17/19 1747)           This patient presents to the emergency department complaining of right flank pain.  Because of his kidney stone history this was his primary concern.  Urinalysis reveals small  amount of ketonuria.  Therefore, patient encouraged to drink more fluids at home.  Patient did receive IV fluids here.  He does much better after receiving morphine and Zofran.  CT scan appears unremarkable.  Blood work stable.  Renal function preserved.  It is possible he may have passed a kidney stone.  Other differentials include strain, sprain, arthritis, bursitis, tenderness, contusion, spasm.  The skin was observed without evidence of shingles at this time.  However, patient advised to look out for a rash, as this may be suggestive of early shingles.    My suspicion is low for intussusception, PE,acute surgical abdomen, obstruction, perforation, abscess, mesenteric ischemia, AAA, dissection, cholecystitis, cholangitis, pancreatitis, appendicitis, C. diff colitis, diverticulitis, volvulus, incarcerated hernia, necrotizing fasciitis,testicular torsion, epididymitis, varicocele, hydrocele, orchitis, incarcerated hernia, Fournier gangrene, pyelonephritis, perinephric abscess, kidney stone, urosepsis, fistula, cauda equina, central cord syndrome, acute spinefracture or dislocation, epidural abscess or hematoma, discitis, meningitis, encephalitis, transverse myelitis,  or other concerning pathology.    Patient advised to follow-up with PCP, nephrologist and neurologist.  Was given return precautions.  Vital stable here, and patient is stable for discharge.    FINAL IMPRESSION      1. Acute right flank pain    2. Renal cyst          DISPOSITION/PLAN   DISPOSITION Decision To Discharge 11/17/2019 05:59:48 PM      PATIENT REFERRED TO:  follow up with your PCP, urologist and nephrologist          [facility] Emergency Department  [address]  [place] [address]  [phone]    If symptoms worsen      DISCHARGE MEDICATIONS:  New Prescriptions    ACETAMINOPHEN (APAP EXTRA STRENGTH) 500 MG TABLET    Take 1 tablet by mouth every 6 hours as needed for Pain    ONDANSETRON (ZOFRAN ODT) 4 MG DISINTEGRATING  TABLET    Take 1-2 tablets by mouth every 12 hours as needed for Nausea May Sub regular tablet (non-ODT) if insurance does not cover ODT.       DISCONTINUED MEDICATIONS:  Discontinued Medications    No medications on file              (  Please note that portions of this note were completed with a voice recognition program.  Efforts were made to edit the dictations but occasionally words are mis-transcribed.)    Patrcia Dolly, PA-C (electronically signed)           Patrcia Dolly, PA-C  11/17/19 1810

## 2019-11-30 NOTE — ED Notes (Signed)
Formatting of this note might be different from the original.  Bed: ER 08  Expected date:   Expected time:   Means of arrival:   Comments:  2  Electronically signed by Robby Sermon, RN at 11/30/2019 12:39 PM EDT

## 2019-11-30 NOTE — ED Triage Notes (Signed)
Formatting of this note might be different from the original.  Pt with h/o kidney stones had nephrectomy left side d/t kidney stones presents with c/o rt flank pain off and on for a week worse today alert skin warm and dry   Electronically signed by Caroleen Hamman, RN at 11/30/2019 12:15 PM EDT

## 2019-11-30 NOTE — ED Provider Notes (Signed)
Formatting of this note is different from the original.    CHIEF COMPLAINT  Chief Complaint   Patient presents with   ? Flank Pain     HPI  Christopher Mulhollandis a 59 y.o. male who presents to the emergency department because of flank pain. Patient has right flank pain been there off and on for the past 1 week. Thinks his urine is low but dark. Does have a history of left nephrectomy 3 years ago because of kidney stones. Is from [place]. His urologist is in [place]. Denies any fevers or vomiting pain is moderate usually is Dilaudid for his pain does have some dysuria    REVIEW OF SYSTEMS  Negative for headaches neck pain negative shortness of breath chest pain fevers all systems negative except as marked.  Electronic medical records reviewed.    PAST MEDICAL HISTORY  Past Medical History:   Diagnosis Date   ? Hypertension    ? Renal and urologic disorders      FAMILY HISTORY  No family history on file.    SOCIAL HISTORY  Social History     Socioeconomic History   ? Marital status: Married     Spouse name: None   ? Number of children: None   ? Years of education: None   ? Highest education level: None   Occupational History   ? None   Tobacco Use   ? Smoking status: Current Every Day Smoker     Packs/day: 1.00     Years: 25.00     Pack years: 25.00     Types: Cigarettes   ? Smokeless tobacco: Never Used   Vaping Use   ? Vaping Use: Never used   Substance and Sexual Activity   ? Alcohol use: Not Currently   ? Drug use: Never   ? Sexual activity: Not Currently   Other Topics Concern   ? None   Social History Narrative   ? None     Social Determinants of Health     Financial Resource Strain:    ? Difficulty of Paying Living Expenses:    Food Insecurity:    ? Worried About Programme researcher, broadcasting/film/video in the Last Year:    ? Barista in the Last Year:    Transportation Needs:    ? Freight forwarder (Medical):    ? Lack of Transportation (Non-Medical):    Physical Activity:    ? Days of Exercise per Week:    ? Minutes of  Exercise per Session:    Stress:    ? Feeling of Stress :    Social Connections:    ? Frequency of Communication with Friends and Family:    ? Frequency of Social Gatherings with Friends and Family:    ? Attends Religious Services:    ? Active Member of Clubs or Organizations:    ? Attends Banker Meetings:    ? Marital Status:    Intimate Partner Violence:    ? Fear of Current or Ex-Partner:    ? Emotionally Abused:    ? Physically Abused:    ? Sexually Abused:      SURGICAL HISTORY  Past Surgical History:   Procedure Laterality Date   ? KIDNEY REMOVAL  08/24/2016     CURRENT MEDICATIONS  No current facility-administered medications for this encounter.     Current Outpatient Medications   Medication Sig Dispense Refill   ? acetaminophen (TYLENOL EXTRA STRENGTH) 500 mg  tablet Take 2 tablets (1,000 mg total) by mouth every 6 hours as needed for Pain. 30 tablet 0   ? atenoloL (TENORMIN) 50 mg tablet Take 50 mg by mouth daily.     ? methocarbamoL (ROBAXIN) 750 mg tablet Take 1-2 tablets (750-1,500 mg total) by mouth 3 times a day for 7 days. 42 tablet 0     ALLERGIES  No Known Allergies    IMMUNIZATIONS;   Noncontributory    PHYSICAL EXAM  VITAL SIGNS: BP 145/80   Pulse 55   Temp 98.7 F (37.1 C)   Resp 17   Ht 5\' 9"  (1.753 m)   Wt 275 lb (124.7 kg)   SpO2 98%   BMI 40.61 kg/m   Constitutional: Alert 59 y.o. male who does not appear toxic or acutely ill.  HENT: Atraumatic, Normocephalic, Bilateral external ears normal, Oropharynx moist, Nose normal.   Eyes: PERRLA, EOMI, Conjunctiva normal, No discharge.   Neck: Supple, Normal range of motion, no JVD or meningismus.   Cardiovascular: Normal heart rate, Normal rhythm, No murmurs, No rubs, No gallops.   Thorax & Lungs: Normal breath sounds, No respiratory distress, No wheezing, Rales or Rhonchi, No chest tenderness.   Abdomen:  Soft, No tenderness, No masses, No pulsatile masses. Right-sided flank tenderness  Skin: Warm, Dry, No erythema, No rash.    Back: No tenderness positive for right-sided CVA tenderness.   Genitalia: Deferred  Rectal:  Deferred.   Extremities: Intact distal pulses, No edema, No tenderness, No cyanosis, No clubbing.   Musculoskeletal: Good range of motion in all major joints. No tenderness to palpation or major deformities noted.   Neurologic: Alert & oriented x 3, Normal motor function, Normal sensory function, No focal deficits noted.   Psychiatric: Affect normal, Judgment normal, Mood normal.     DIAGNOSTIC RESULTS     CBC W/DIFF - Abnormal; Notable for the following components:       Result Value Ref Range Status    Platelet count 152 (*) 154 - 393 K/uL Final    Comment: R    All other components within normal limits   KHN MANUAL DIFFERENTIAL - Abnormal; Notable for the following components:    Monocytes Absolute 0.1 (*) 0.3 - 0.9 K/uL Final    All other components within normal limits   LIVER PROFILE PANEL - Normal   LIPASE - Normal   BASIC METABOLIC PANEL   URINALYSIS WITH REFLEX TO SCOPE     RADIOLOGY   Read by radiology reviewed by me: Read by radiology  CT-ABD/PELVIS NO CONTRAST   Final Result     Status post left nephrectomy.     Benign right renal cortical cyst. No evidence of renal or ureteral obstruction.     Diverticulosis of the distal colon.     No other acute findings in the abdomen or pelvis.     Electronically Signed by: 59, MD, 11/30/2019 2:12 PM         ED COURSE     during hospital course patient requested to dose of Dilaudid this is given.    PROCEDURES  None    CRITICAL CARE   None    CONSULTATIONS  None    MEDICAL DECISION MAKING    patient has back pain this is more in the right side is more around the right flank.  CT does not reveal kidney stone urine is no hematuria.  This is likely musculoskeletal.  Patient will be discharged with muscle relaxers  he will be not given anti-inflammatories because of the solitary kidney.  He will be given Tylenol.    FINAL IMPRESSION   1. Flank pain      PLAN discharge  with outpatient follow-up    Otila Kluver, PA-C  11/30/2019  3:21 PM      Otila Kluver, PA-C  11/30/19 [phone]    Electronically signed by Ardath Sax, MD at 11/30/2019  4:43 PM EDT    Associated attestation - Ardath Sax, MD - 11/30/2019  4:43 PM EDT  Formatting of this note might be different from the original.  This patient was seen individually by a mid-level provider.  I did not see this patient, however I was available for supervision and consultation as needed.

## 2020-02-10 IMAGING — CT CT RENAL STONE PROTOCOL
2 of 4 series · 17 of 46 positions shown, 19 images · non-contrast
Comparison: CT scan dated 11/16/2018

CLINICAL DATA: Right flank pain.

EXAM:
CT ABDOMEN AND PELVIS WITHOUT CONTRAST
TECHNIQUE: Multidetector CT imaging of the abdomen and pelvis was performed
following the standard protocol without IV contrast.

[Series 2: axial st · axial · 0.98mm/px · z∈[-562,-112]mm · 14 of 98 slices shown, 16 images]
[im 4/98  soft-tissue]
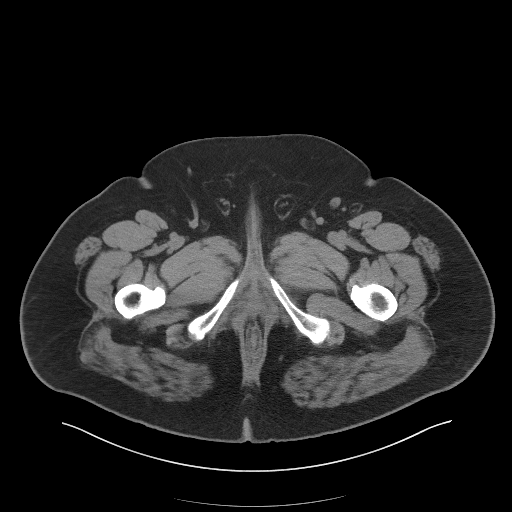
[im 4/98  bone]
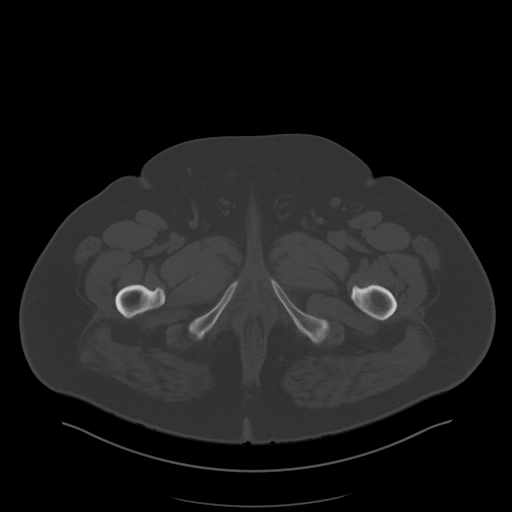
[im 12/98  soft-tissue]
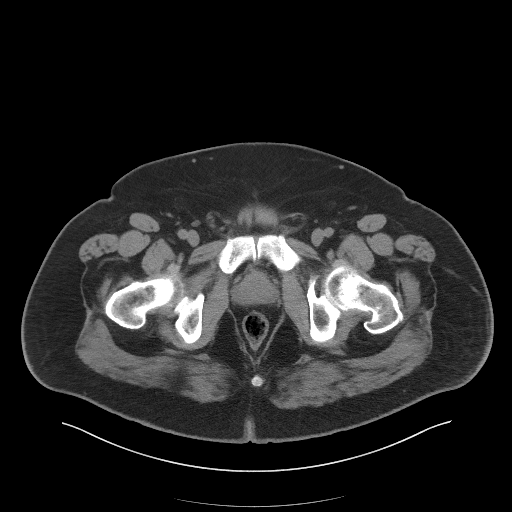
[im 20/98  soft-tissue]
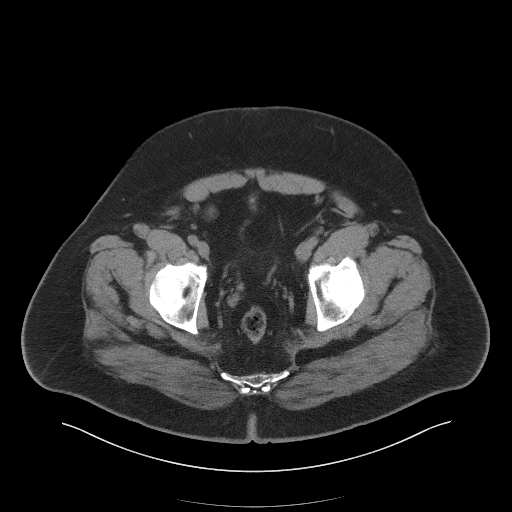
[im 28/98  soft-tissue]
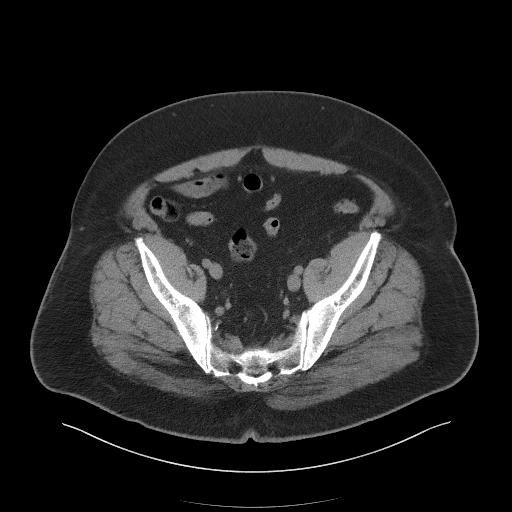
[im 32/98  soft-tissue]
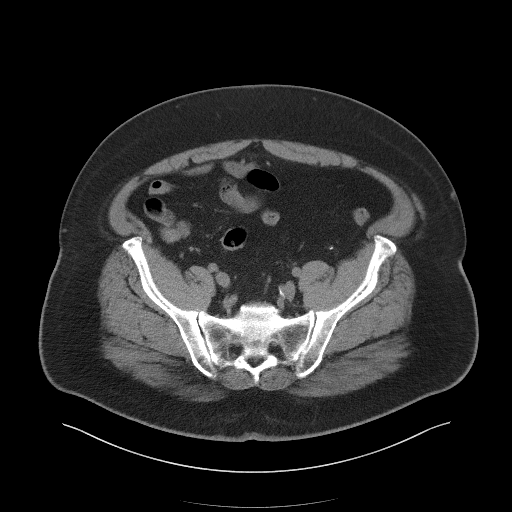
[im 39/98  soft-tissue]
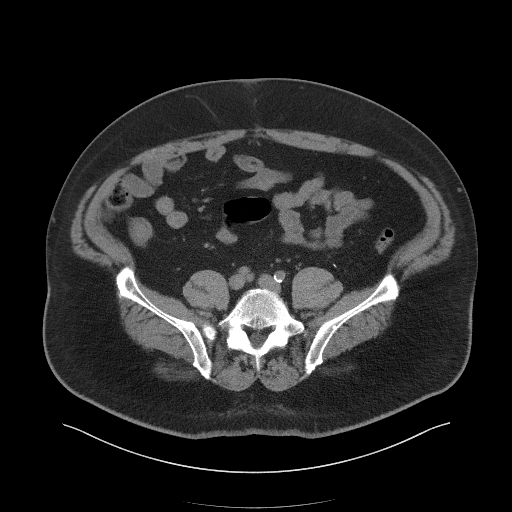
[im 47/98  soft-tissue]
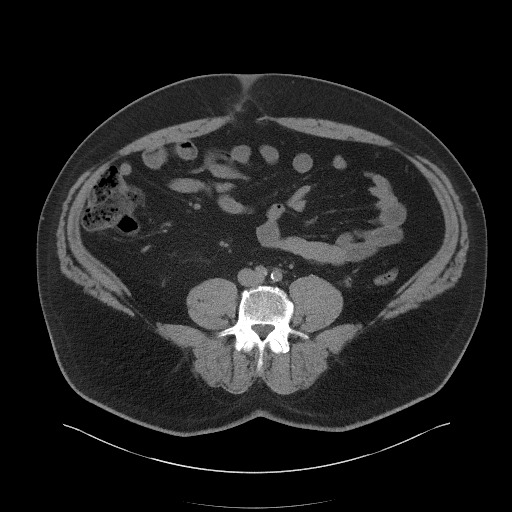
[im 51/98  soft-tissue]
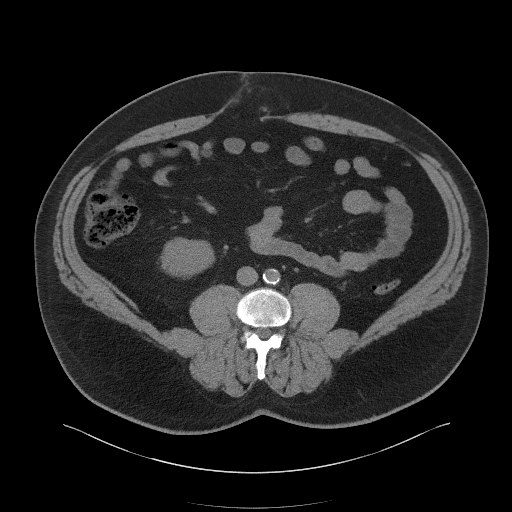
[im 59/98  soft-tissue]
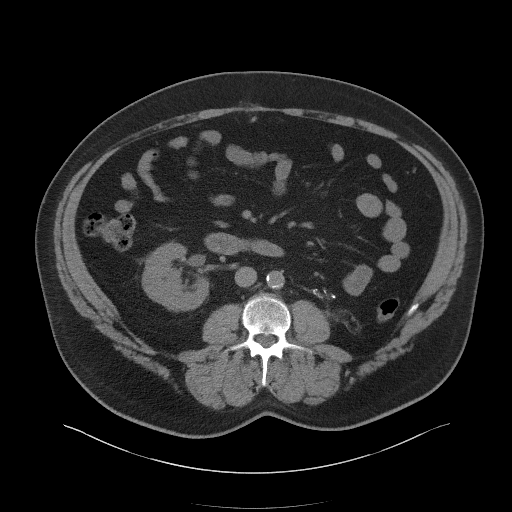
[im 59/98  bone]
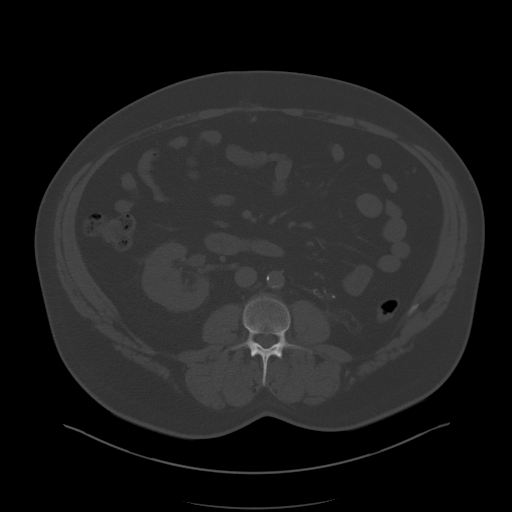
[im 66/98  soft-tissue]
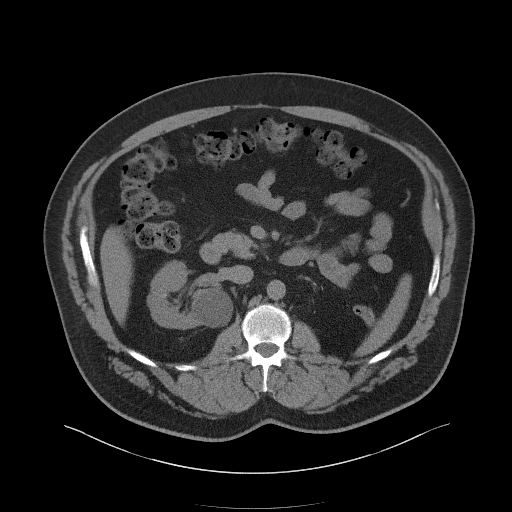
[im 74/98  soft-tissue]
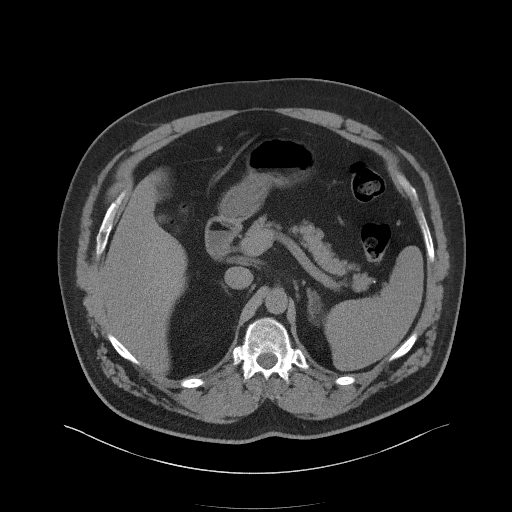
[im 78/98  soft-tissue]
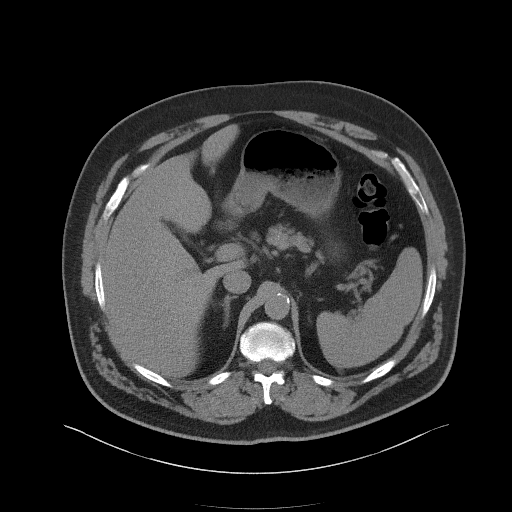
[im 86/98  soft-tissue]
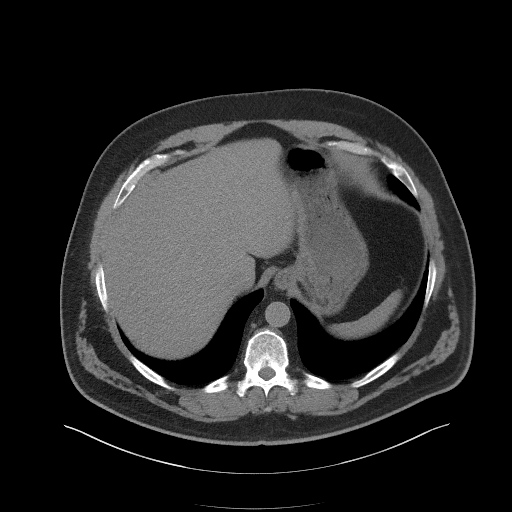
[im 94/98  soft-tissue]
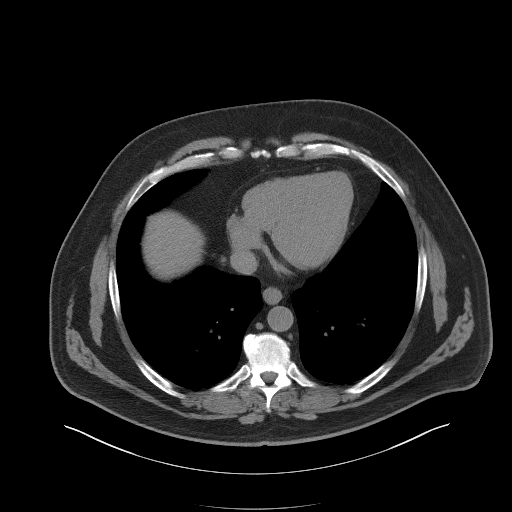

[Series 4: coronal st · coronal · 0.95mm/px · 3 of 128 slices shown]
[im 43/128  soft-tissue]
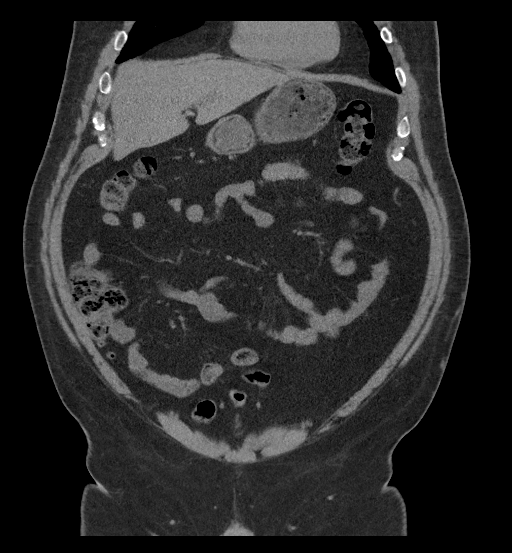
[im 57/128  soft-tissue]
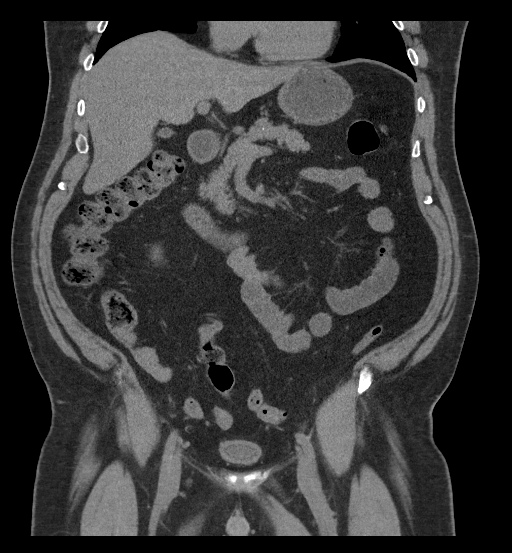
[im 71/128  soft-tissue]
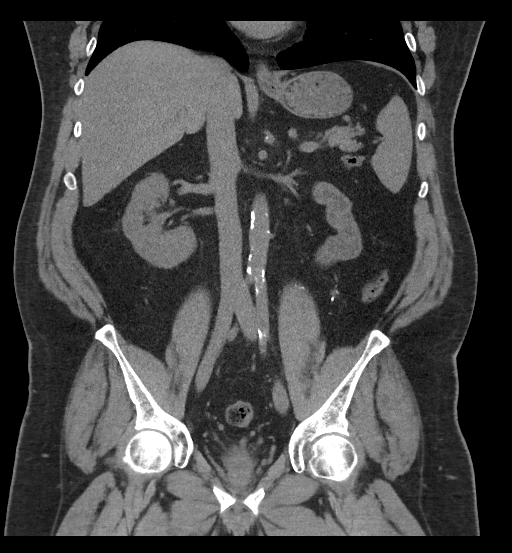

[17 of 46 positions shown; findings below may reference images not displayed]

FINDINGS: Lower chest: Normal.

Hepatobiliary: No focal liver abnormality is seen. No gallstones,
gallbladder wall thickening, or biliary dilatation.

Pancreas: The pancreas appears normal. The description of nodules
adjacent to the pancreatic head on the prior study is again noted
but this appears to be benign lobulation of the pancreatic head
rather than adenopathy. There has been no change since the prior
study.

Spleen: Normal in size without focal abnormality.

Adrenals/Urinary Tract: Normal adrenal glands. Benign stable 3.8 cm
cyst on the medial aspect of right kidney no renal calculi. No
hydronephrosis. Minimal chronic soft tissue stranding in the
perinephric space. Previous left nephrectomy. Bladder is normal.

Stomach/Bowel: Stomach is within normal limits. Appendix appears
normal. No evidence of bowel wall thickening, distention, or
inflammatory changes.

Vascular/Lymphatic: Aortic atherosclerosis. No enlarged abdominal or
pelvic lymph nodes.

Reproductive: Prostate is unremarkable.

Other: There is a small midline periumbilical hernia containing only
fat minimally more prominent than on the prior study. The abdominal
wall defect is approximately 4.6 cm.

Musculoskeletal: No acute or significant osseous findings.
IMPRESSION: 1. No acute abnormality of the abdomen or pelvis.
2. Slight increase in the size of the small midline periumbilical
hernia containing only fat.
3. Aortic Atherosclerosis ([id number]).

## 2020-04-26 ENCOUNTER — Emergency Department (HOSPITAL_BASED_OUTPATIENT_CLINIC_OR_DEPARTMENT_OTHER): Payer: Medicaid - Out of State

## 2020-04-26 ENCOUNTER — Encounter (HOSPITAL_BASED_OUTPATIENT_CLINIC_OR_DEPARTMENT_OTHER): Payer: Self-pay | Admitting: *Deleted

## 2020-04-26 ENCOUNTER — Other Ambulatory Visit: Payer: Self-pay

## 2020-04-26 ENCOUNTER — Emergency Department (HOSPITAL_BASED_OUTPATIENT_CLINIC_OR_DEPARTMENT_OTHER)
Admission: EM | Admit: 2020-04-26 | Discharge: 2020-04-26 | Disposition: A | Discharge status: Home / Self Care | Payer: Medicaid - Out of State | Attending: Emergency Medicine | Admitting: Emergency Medicine

## 2020-04-26 DIAGNOSIS — I10 Essential (primary) hypertension: Secondary | ICD-10-CM | POA: Diagnosis not present

## 2020-04-26 DIAGNOSIS — Z87442 Personal history of urinary calculi: Secondary | ICD-10-CM | POA: Diagnosis not present

## 2020-04-26 DIAGNOSIS — K219 Gastro-esophageal reflux disease without esophagitis: Secondary | ICD-10-CM | POA: Diagnosis not present

## 2020-04-26 DIAGNOSIS — R109 Unspecified abdominal pain: Secondary | ICD-10-CM | POA: Diagnosis not present

## 2020-04-26 DIAGNOSIS — R110 Nausea: Secondary | ICD-10-CM | POA: Diagnosis not present

## 2020-04-26 DIAGNOSIS — F17210 Nicotine dependence, cigarettes, uncomplicated: Secondary | ICD-10-CM | POA: Insufficient documentation

## 2020-04-26 DIAGNOSIS — Z79899 Other long term (current) drug therapy: Secondary | ICD-10-CM | POA: Diagnosis not present

## 2020-04-26 LAB — COMPREHENSIVE METABOLIC PANEL
ALT: 26 U/L (ref 0–44)
AST: 21 U/L (ref 15–41)
Albumin: 4.2 g/dL (ref 3.5–5.0)
Alkaline Phosphatase: 56 U/L (ref 38–126)
Anion gap: 8 (ref 5–15)
BUN: 14 mg/dL (ref 6–20)
CO2: 26 mmol/L (ref 22–32)
Calcium: 8.8 mg/dL — ABNORMAL LOW (ref 8.9–10.3)
Chloride: 100 mmol/L (ref 98–111)
Creatinine, Ser: 1.23 mg/dL (ref 0.61–1.24)
GFR, Estimated: >60 mL/min (ref >60)
Glucose, Bld: 86 mg/dL (ref 70–99)
Potassium: 4.2 mmol/L (ref 3.5–5.1)
Sodium: 134 mmol/L — ABNORMAL LOW (ref 135–145)
Total Bilirubin: 0.5 mg/dL (ref 0.3–1.2)
Total Protein: 7.2 g/dL (ref 6.5–8.1)

## 2020-04-26 LAB — CBC WITH DIFFERENTIAL/PLATELET
Abs Immature Granulocytes: 0.03 10*3/uL (ref 0.00–0.07)
Basophils Absolute: 0.1 10*3/uL (ref 0.0–0.1)
Basophils Relative: 1 %
Eosinophils Absolute: 0.3 10*3/uL (ref 0.0–0.5)
Eosinophils Relative: 4 %
HCT: 46.8 % (ref 39.0–52.0)
Hemoglobin: 15.9 g/dL (ref 13.0–17.0)
Immature Granulocytes: 0 %
Lymphocytes Relative: 25 %
Lymphs Abs: 1.8 10*3/uL (ref 0.7–4.0)
MCH: 31.6 pg (ref 26.0–34.0)
MCHC: 34 g/dL (ref 30.0–36.0)
MCV: 93 fL (ref 80.0–100.0)
Monocytes Absolute: 0.5 10*3/uL (ref 0.1–1.0)
Monocytes Relative: 7 %
Neutro Abs: 4.5 10*3/uL (ref 1.7–7.7)
Neutrophils Relative %: 63 %
Platelets: 149 10*3/uL — ABNORMAL LOW (ref 150–400)
RBC: 5.03 MIL/uL (ref 4.22–5.81)
RDW: 13.4 % (ref 11.5–15.5)
WBC: 7.2 10*3/uL (ref 4.0–10.5)
nRBC: 0 % (ref 0.0–0.2)

## 2020-04-26 LAB — URINALYSIS, ROUTINE W REFLEX MICROSCOPIC
Bilirubin Urine: NEGATIVE
Glucose, UA: NEGATIVE mg/dL
Hgb urine dipstick: NEGATIVE
Ketones, ur: NEGATIVE mg/dL
Leukocytes,Ua: NEGATIVE
Nitrite: NEGATIVE
Protein, ur: NEGATIVE mg/dL
Specific Gravity, Urine: >1.03 — ABNORMAL HIGH (ref 1.005–1.030)
pH: 6 (ref 5.0–8.0)

## 2020-04-26 MED ORDER — LIDOCAINE 5 % EX PTCH
1.0000 | MEDICATED_PATCH | CUTANEOUS | Status: DC
  Administered 2020-04-26: 1 via TRANSDERMAL
  Filled 2020-04-26: qty 1

## 2020-04-26 MED ORDER — MORPHINE SULFATE (PF) 2 MG/ML IV SOLN
2.0000 mg | Freq: Once | INTRAVENOUS | Status: AC
  Administered 2020-04-26: 2 mg via INTRAVENOUS
  Filled 2020-04-26: qty 1

## 2020-04-26 MED ORDER — METHOCARBAMOL 500 MG PO TABS: 500.0000 mg | ORAL_TABLET | Freq: Two times a day (BID) | ORAL | 0 refills | Status: AC

## 2020-04-26 MED ORDER — ONDANSETRON HCL 4 MG/2ML IJ SOLN
4.0000 mg | Freq: Once | INTRAMUSCULAR | Status: AC
  Administered 2020-04-26: 4 mg via INTRAVENOUS
  Filled 2020-04-26: qty 2

## 2020-04-26 NOTE — ED Notes (Signed)
Urine requested  ?

## 2020-04-26 NOTE — ED Provider Notes (Signed)
[facility] EMERGENCY DEPARTMENT Provider Note   CSN: [id number] Arrival date & time: 04/26/20  1307     History Chief Complaint  Patient presents with  . Flank Pain    Christopher Archer is a 59 y.o. male who presents with concern for 3 days of progressively worsening right flank pain.  Additionally he noted darkening of the color of his urine, as well as a new foul odor.   Patient with history of recurrent kidney stones in the past. Patient with history of left nephrectomy approximately 3 is ago due to >50 stones present in the left kidney in that caused deterioration of its function to GFR less than 20. Endorses interval history of right nephrolithiasis as well.  He denies fevers, chills at home, endorses nausea but denies vomiting.  Denies radiation of pain into abdomen, scrotum, testicles. Pain is worse with movement.   I personally reviewed patient's medical records.  He has history of hypertension, renal cysts, left nephrectomy, GERD. Additionally, the patient lives in [place], where he followss regularly with a urologist, however he frequently visits his brother locally. He has been evaluated in this ED multiple times for similar right flank pain without finding of renal stone.   HPI     Past Medical History:  Diagnosis Date  . Hypertension   . 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   Past Surgical History:  Procedure Laterality Date  . KIDNEY CYST REMOVAL    . NEPHRECTOMY         No family history on file.  Social History   Tobacco Use  . Smoking status: Current Every Day Smoker    Types: Cigarettes  . Smokeless tobacco: Former Clinical biochemist  . Vaping Use: Never used  Substance Use Topics  . Alcohol use: Not Currently    Comment: rare  . Drug use: Never    Home Medications Prior to Admission medications   Medication Sig Start Date End Date Taking? Authorizing Provider   albuterol (VENTOLIN HFA) 108 (90 Base) MCG/ACT inhaler 1-2 inhalations every 4-6 hours as needed for wheezing. Dispense spacer as needed. 04/27/18 04/27/19  [provider]  atenolol (TENORMIN) 50 MG tablet Take by mouth.    [provider]  clonazePAM (KLONOPIN) 0.5 MG tablet Take by mouth.    [provider]  cyclobenzaprine (FLEXERIL) 10 MG tablet Take 1 tablet (10 mg total) by mouth 2 (two) times daily as needed for muscle spasms. 06/04/19   Arby Barrette, MD  Ergocalciferol POWD Take by mouth.    [provider]  methocarbamol (ROBAXIN) 500 MG tablet Take 1 tablet (500 mg total) by mouth 2 (two) times daily for 5 days. 04/26/20 05/01/20  Mia Winthrop, Eugene Gavia, PA-C  omeprazole (PRILOSEC) 20 MG capsule Take by mouth. 09/16/16   [provider]  ondansetron (ZOFRAN ODT) 4 MG disintegrating tablet Take 1 tablet (4 mg total) by mouth every 8 (eight) hours as needed for nausea. 06/17/18   Garlon Hatchet, PA-C  oxyCODONE-acetaminophen (PERCOCET) 5-325 MG tablet Take 1 tablet by mouth every 4 (four) hours as needed. 06/17/18   Garlon Hatchet, PA-C    Allergies    Ketorolac and Codeine  Review of Systems   Review of Systems  Constitutional: Negative for activity change, appetite change, chills, diaphoresis, fatigue and fever.  Respiratory: Negative.   Cardiovascular: Negative.   Gastrointestinal: Positive for nausea.  Negative for abdominal pain, blood in stool, constipation, diarrhea and vomiting.  Genitourinary: Positive for difficulty urinating and flank pain. Negative for dysuria, frequency and hematuria.       Dark urine, foul smell   Skin: Negative.   Hematological: Negative.   Psychiatric/Behavioral: Negative.     Physical Exam Updated Vital Signs BP (!) 150/109   Pulse (!) 59   Temp 98.2 F (36.8 C) (Oral)   Resp 18   Ht 5\' 9"  (1.753 m)   Wt 124.7 kg   SpO2 98%   BMI 40.61 kg/m   Physical Exam Vitals and nursing note reviewed.   Constitutional:      Appearance: He is obese.  HENT:     Head: Normocephalic and atraumatic.     Nose: Nose normal.     Mouth/Throat:     Mouth: Mucous membranes are moist.     Pharynx: Oropharynx is clear. No oropharyngeal exudate or posterior oropharyngeal erythema.     Tonsils: No tonsillar exudate.  Eyes:     General:        Right eye: No discharge.        Left eye: No discharge.     Extraocular Movements: Extraocular movements intact.     Conjunctiva/sclera: Conjunctivae normal.     Pupils: Pupils are equal, round, and reactive to light.  Neck:     Trachea: Trachea and phonation normal.  Cardiovascular:     Rate and Rhythm: Normal rate and regular rhythm.     Pulses: Normal pulses.     Heart sounds: Normal heart sounds. No murmur heard.   Pulmonary:     Effort: Pulmonary effort is normal. No respiratory distress.     Breath sounds: Normal breath sounds. No wheezing or rales.  Abdominal:     General: Bowel sounds are normal. There is no distension.     Palpations: Abdomen is soft.     Tenderness: There is no abdominal tenderness. There is no right CVA tenderness or left CVA tenderness.     Comments: No TTP of the right flank; patient states pain is "deep inside".   Musculoskeletal:        General: No deformity.     Cervical back: Neck supple. No rigidity, tenderness or crepitus. No pain with movement, spinous process tenderness or muscular tenderness.     Right lower leg: No edema.     Left lower leg: No edema.  Lymphadenopathy:     Cervical: No cervical adenopathy.  Skin:    General: Skin is warm and dry.     Capillary Refill: Capillary refill takes less than 2 seconds.     Findings: No lesion or rash.     Comments: No skin changes.   Neurological:     Mental Status: He is alert and oriented to person, place, and time. Mental status is at baseline.  Psychiatric:        Mood and Affect: Mood normal.     ED Results / Procedures / Treatments   Labs (all labs  ordered are listed, but only abnormal results are displayed) Labs Reviewed  URINALYSIS, ROUTINE W REFLEX MICROSCOPIC - Abnormal; Notable for the following components:      Result Value   Specific Gravity, Urine >1.030 (*)    All other components within normal limits  COMPREHENSIVE METABOLIC PANEL - Abnormal; Notable for the following components:   Sodium 134 (*)    Calcium 8.8 (*)    All other components within normal limits  CBC WITH DIFFERENTIAL/PLATELET - Abnormal; Notable for the following components:   Platelets 149 (*)    All other components within normal limits    EKG None  Radiology CT Renal Stone Study  Result Date: 04/26/2020 CLINICAL DATA:  Right flank pain for 3 days EXAM: CT ABDOMEN AND PELVIS WITHOUT CONTRAST TECHNIQUE: Multidetector CT imaging of the abdomen and pelvis was performed following the standard protocol without IV contrast. COMPARISON:  06/13/2019, 06/17/2018 FINDINGS: Lower chest: No acute abnormality. Hepatobiliary: No focal liver abnormality is seen. No gallstones, gallbladder wall thickening, or biliary dilatation. Pancreas: Unremarkable. No pancreatic ductal dilatation or surrounding inflammatory changes. Spleen: Normal in size without focal abnormality. Adrenals/Urinary Tract: Normal adrenal glands. Prior left nephrectomy. 4 cm hypodense, fluid attenuating right posterior interpolar renal mass most consistent with a cyst. No right urolithiasis. No hydronephrosis. Normal bladder. Stomach/Bowel: Stomach is within normal limits. Appendix appears normal. No evidence of bowel wall thickening, distention, or inflammatory changes. Diverticulosis of the sigmoid colon without evidence of diverticulitis. Vascular/Lymphatic: Normal caliber abdominal aorta with mild atherosclerosis. No lymphadenopathy. Reproductive: Prostate is unremarkable. Other: Wide-mouth periumbilical fat containing hernia. Musculoskeletal: No acute or significant osseous findings. Bone island in the  left acetabulum. IMPRESSION: 1. No right urolithiasis or obstructive uropathy. 2. Prior left nephrectomy. 3. Diverticulosis without evidence of diverticulitis. 4. Aortic Atherosclerosis (ICD10-I70.0). Electronically Signed   By: Elige KoHetal  Patel   On: 04/26/2020 15:33    Procedures Procedures (including critical care time)  Medications Ordered in ED Medications  morphine 2 MG/ML injection 2 mg (2 mg Intravenous Given 04/26/20 1511)  ondansetron (ZOFRAN) injection 4 mg (4 mg Intravenous Given 04/26/20 1510)  morphine 2 MG/ML injection 2 mg (2 mg Intravenous Given 04/26/20 1703)    ED Course  I have reviewed the triage vital signs and the nursing notes.  Pertinent labs & imaging results that were available during my care of the patient were reviewed by me and considered in my medical decision making (see chart for details).    MDM Rules/Calculators/A&P                         59 year old male with history of left nephrectomy and recurrent nephrolithiasis who presents with 3 days of right flank pain and foul-smelling, dark urine.   Differential diagnosis for this patient's symptoms include but are not limited to nephrolithiasis, obstructive uropathy, pyelonephritis, biliary colic, pancreatitis, musculoskeletal injury, herpes zoster, appendicitis, pneumonia, AAA.    Hypertensive on intake to 156/86. Vital signs otherwise normal.   Physical exam is nonspecific. Cardiopulmonary exam is normal, abdominal exam is benign. No CVA or suprapubic TTP.   Will proceed with laboratory studies and CT renal study. Analgesia offered.   CBC unremarkable, CMP unremarkable.   UA very reassuring, unremarkable.   CT renal study negative for right urolithiasis or hydronephrosis. Right posterior interpolar renal mass, likely cyst. (known to patient).    Work up is very reassuring. There does not appear to be any imminent threat to the patient's remaining kidney, and there does not appear to be any emergent  cause for this patient's pain. Symptoms are likely secondary to muscle spasm / strain. Will offer lidocaine patch here, will discharge with Rx for muscle relaxer.   Christopher Archer voiced understanding of his medical evaluation and treatment plan. Each of his questions were answered to his expressed satisfaction. Return precautions were given. Patient is well-appearing, stable, and is appropriate for discharge at this time.   Final  Clinical Impression(s) / ED Diagnoses Final diagnoses:  Right flank pain    Rx / DC Orders ED Discharge Orders         Ordered    methocarbamol (ROBAXIN) 500 MG tablet  2 times daily        04/26/20 [address] Eugene Gavia, PA-C 04/27/20 1514    Derwood Kaplan, MD 04/27/20 1708

## 2020-04-26 NOTE — ED Triage Notes (Signed)
Pt reports right flank pain x 3 days. States urine has been dark. He states he lost his left kidney due to kidney stones

## 2020-04-26 NOTE — Discharge Instructions (Addendum)
You were evaluated in the emergency department today for your right lower back pain.    Your physical exam and vital signs were very reassuring.  Your blood work did not show any sign of infection.  Your urine also did not show any sign of infection. CT scan of your abdomen pelvis did not reveal any kidney stones. This is good news! There is a small cyst on the bottom of your right kidney that may be monitored by your urologist at home.  There is no swelling or fluid around your kidney.  Cause of your back pain remains unclear.  You have been prescribed a muscle relaxer called Robaxin, that may help with this pain.  Additionally your given a lidocaine patch to this area of your back while in the emergency department.  You may continue to utilize topical pain relief such as Biofreeze, icy hot, lidocaine patches as needed at home.  Please follow-up as scheduled with your urologist at home.  Return to the emergency department for Worsening pain, blood in your urine, nausea or vomiting that does not stop, fevers, chills, or other new severe symptoms.

## 2020-04-26 NOTE — ED Notes (Signed)
Formatting of this note might be different from the original.  Urine requested.  Electronically signed by Herbert Moors, RN at 04/26/2020  1:42 PM EST

## 2020-04-26 NOTE — ED Provider Notes (Signed)
Formatting of this note is different from the original.  Images from the original note were not included.    [facility] EMERGENCY DEPARTMENT  Provider Note    CSN: [id number]  Arrival date & time: 04/26/20  1307        History  Chief Complaint   Patient presents with   ? Flank Pain     Christopher Peters is a 59 y.o. male who presents with concern for 3 days of progressively worsening right flank pain.  Additionally he noted darkening of the color of his urine, as well as a new foul odor.     Patient with history of recurrent kidney stones in the past. Patient with history of left nephrectomy approximately 3 is ago due to >50 stones present in the left kidney in that caused deterioration of its function to GFR less than 20. Endorses interval history of right nephrolithiasis as well.    He denies fevers, chills at home, endorses nausea but denies vomiting.  Denies radiation of pain into abdomen, scrotum, testicles. Pain is worse with movement.     I personally reviewed patient's medical records.  He has history of hypertension, renal cysts, left nephrectomy, GERD. Additionally, the patient lives in [place], where he followss regularly with a urologist, however he frequently visits his brother locally. He has been evaluated in this ED multiple times for similar right flank pain without finding of renal stone.     HPI        Past Medical History:   Diagnosis Date   ? Hypertension    ? 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    Past Surgical History:   Procedure Laterality Date   ? KIDNEY CYST REMOVAL     ? NEPHRECTOMY         No family history on file.    Social History     Tobacco Use   ? Smoking status: Current Every Day Smoker     Types: Cigarettes   ? Smokeless tobacco: Former [employer] Use   ? Vaping Use: Never used   Substance Use Topics   ? Alcohol use: Not Currently     Comment: rare   ? Drug use: Never     Home Medications  Prior to  Admission medications    Medication Sig Start Date End Date Taking? Authorizing Provider   albuterol (VENTOLIN HFA) 108 (90 Base) MCG/ACT inhaler 1-2 inhalations every 4-6 hours as needed for wheezing. Dispense spacer as needed. 04/27/18 04/27/19  [provider]   atenolol (TENORMIN) 50 MG tablet Take by mouth.    [provider]   clonazePAM (KLONOPIN) 0.5 MG tablet Take by mouth.    [provider]   cyclobenzaprine (FLEXERIL) 10 MG tablet Take 1 tablet (10 mg total) by mouth 2 (two) times daily as needed for muscle spasms. 06/04/19   Arby Barrette, MD   Ergocalciferol POWD Take by mouth.    [provider]   methocarbamol (ROBAXIN) 500 MG tablet Take 1 tablet (500 mg total) by mouth 2 (two) times daily for 5 days. 04/26/20 05/01/20  Sponseller, Eugene Gavia, PA-C   omeprazole (PRILOSEC) 20 MG capsule Take by mouth. 09/16/16   [provider]   ondansetron (ZOFRAN ODT) 4 MG disintegrating tablet Take 1 tablet (4 mg total) by mouth every 8 (eight) hours as needed for nausea. 06/17/18  Garlon Hatchet, PA-C   oxyCODONE-acetaminophen (PERCOCET) 5-325 MG tablet Take 1 tablet by mouth every 4 (four) hours as needed. 06/17/18   Garlon Hatchet, PA-C     Allergies     Ketorolac and Codeine    Review of Systems    Review of Systems   Constitutional: Negative for activity change, appetite change, chills, diaphoresis, fatigue and fever.   Respiratory: Negative.    Cardiovascular: Negative.    Gastrointestinal: Positive for nausea. Negative for abdominal pain, blood in stool, constipation, diarrhea and vomiting.   Genitourinary: Positive for difficulty urinating and flank pain. Negative for dysuria, frequency and hematuria.        Dark urine, foul smell    Skin: Negative.    Hematological: Negative.    Psychiatric/Behavioral: Negative.      Physical Exam  Updated Vital Signs  BP (!) 150/109   Pulse (!) 59   Temp 98.2 F (36.8 C) (Oral)   Resp 18   Ht 5\' 9"  (1.753 m)   Wt 124.7 kg    SpO2 98%   BMI 40.61 kg/m     Physical Exam  Vitals and nursing note reviewed.   Constitutional:       Appearance: He is obese.   HENT:      Head: Normocephalic and atraumatic.      Nose: Nose normal.      Mouth/Throat:      Mouth: Mucous membranes are moist.      Pharynx: Oropharynx is clear. No oropharyngeal exudate or posterior oropharyngeal erythema.      Tonsils: No tonsillar exudate.   Eyes:      General:         Right eye: No discharge.         Left eye: No discharge.      Extraocular Movements: Extraocular movements intact.      Conjunctiva/sclera: Conjunctivae normal.      Pupils: Pupils are equal, round, and reactive to light.   Neck:      Trachea: Trachea and phonation normal.   Cardiovascular:      Rate and Rhythm: Normal rate and regular rhythm.      Pulses: Normal pulses.      Heart sounds: Normal heart sounds. No murmur heard.     Pulmonary:      Effort: Pulmonary effort is normal. No respiratory distress.      Breath sounds: Normal breath sounds. No wheezing or rales.   Abdominal:      General: Bowel sounds are normal. There is no distension.      Palpations: Abdomen is soft.      Tenderness: There is no abdominal tenderness. There is no right CVA tenderness or left CVA tenderness.      Comments: No TTP of the right flank; patient states pain is "deep inside".    Musculoskeletal:         General: No deformity.      Cervical back: Neck supple. No rigidity, tenderness or crepitus. No pain with movement, spinous process tenderness or muscular tenderness.      Right lower leg: No edema.      Left lower leg: No edema.   Lymphadenopathy:      Cervical: No cervical adenopathy.   Skin:     General: Skin is warm and dry.      Capillary Refill: Capillary refill takes less than 2 seconds.      Findings: No lesion or rash.  Comments: No skin changes.    Neurological:      Mental Status: He is alert and oriented to person, place, and time. Mental status is at baseline.   Psychiatric:         Mood and  Affect: Mood normal.     ED Results / Procedures / Treatments    Labs  (all labs ordered are listed, but only abnormal results are displayed)  Labs Reviewed   URINALYSIS, ROUTINE W REFLEX MICROSCOPIC - Abnormal; Notable for the following components:       Result Value    Specific Gravity, Urine >1.030 (*)     All other components within normal limits   COMPREHENSIVE METABOLIC PANEL - Abnormal; Notable for the following components:    Sodium 134 (*)     Calcium 8.8 (*)     All other components within normal limits   CBC WITH DIFFERENTIAL/PLATELET - Abnormal; Notable for the following components:    Platelets 149 (*)     All other components within normal limits     EKG  None    Radiology  CT Renal Stone Study    Result Date: 04/26/2020  CLINICAL DATA:  Right flank pain for 3 days EXAM: CT ABDOMEN AND PELVIS WITHOUT CONTRAST TECHNIQUE: Multidetector CT imaging of the abdomen and pelvis was performed following the standard protocol without IV contrast. COMPARISON:  06/13/2019, 06/17/2018 FINDINGS: Lower chest: No acute abnormality. Hepatobiliary: No focal liver abnormality is seen. No gallstones, gallbladder wall thickening, or biliary dilatation. Pancreas: Unremarkable. No pancreatic ductal dilatation or surrounding inflammatory changes. Spleen: Normal in size without focal abnormality. Adrenals/Urinary Tract: Normal adrenal glands. Prior left nephrectomy. 4 cm hypodense, fluid attenuating right posterior interpolar renal mass most consistent with a cyst. No right urolithiasis. No hydronephrosis. Normal bladder. Stomach/Bowel: Stomach is within normal limits. Appendix appears normal. No evidence of bowel wall thickening, distention, or inflammatory changes. Diverticulosis of the sigmoid colon without evidence of diverticulitis. Vascular/Lymphatic: Normal caliber abdominal aorta with mild atherosclerosis. No lymphadenopathy. Reproductive: Prostate is unremarkable. Other: Wide-mouth periumbilical fat containing hernia.  Musculoskeletal: No acute or significant osseous findings. Bone island in the left acetabulum. IMPRESSION: 1. No right urolithiasis or obstructive uropathy. 2. Prior left nephrectomy. 3. Diverticulosis without evidence of diverticulitis. 4. Aortic Atherosclerosis (ICD10-I70.0). Electronically Signed   By: Elige Ko   On: 04/26/2020 15:33     Procedures  Procedures (including critical care time)    Medications Ordered in ED  Medications   morphine 2 MG/ML injection 2 mg (2 mg Intravenous Given 04/26/20 1511)   ondansetron (ZOFRAN) injection 4 mg (4 mg Intravenous Given 04/26/20 1510)   morphine 2 MG/ML injection 2 mg (2 mg Intravenous Given 04/26/20 1703)     ED Course   I have reviewed the triage vital signs and the nursing notes.    Pertinent labs & imaging results that were available during my care of the patient were reviewed by me and considered in my medical decision making (see chart for details).      MDM Rules/Calculators/A&P    59 year old male with history of left nephrectomy and recurrent nephrolithiasis who presents with 3 days of right flank pain and foul-smelling, dark urine.     Differential diagnosis for this patient's symptoms include but are not limited to nephrolithiasis, obstructive uropathy, pyelonephritis, biliary colic, pancreatitis, musculoskeletal injury, herpes zoster, appendicitis, pneumonia, AAA.      Hypertensive on intake to 156/86. Vital signs otherwise normal.    Physical  exam is nonspecific. Cardiopulmonary exam is normal, abdominal exam is benign. No CVA or suprapubic TTP.    Will proceed with laboratory studies and CT renal study. Analgesia offered.    CBC unremarkable, CMP unremarkable.    UA very reassuring, unremarkable.    CT renal study negative for right urolithiasis or hydronephrosis. Right posterior interpolar renal mass, likely cyst. (known to patient).     Work up is very reassuring. There does not appear to be any imminent threat to the patient's remaining kidney,  and there does not appear to be any emergent cause for this patient's pain. Symptoms are likely secondary to muscle spasm / strain. Will offer lidocaine patch here, will discharge with Rx for muscle relaxer.    Ramon Dredgedward voiced understanding of his medical evaluation and treatment plan. Each of his questions were answered to his expressed satisfaction. Return precautions were given. Patient is well-appearing, stable, and is appropriate for discharge at this time.     Final Clinical Impression(s) / ED Diagnoses  Final diagnoses:   Right flank pain     Rx / DC Orders  ED Discharge Orders          Ordered     methocarbamol (ROBAXIN) 500 MG tablet  2 times daily         04/26/20 [address]1658             Sponseller, Idelia SalmRebekah R, PA-C  04/27/20 1514      Derwood KaplanNanavati, Ankit, MD  04/27/20 1708    Electronically signed by Derwood KaplanNanavati, Ankit, MD at 04/27/2020  5:08 PM EST    Associated attestation - Derwood KaplanNanavati, Ankit, MD - 04/27/2020  5:08 PM EST  Formatting of this note might be different from the original.  Attestation: Medical screening examination/treatment/procedure(s) were conducted as a shared visit with non-physician practitioner(s) and myself.  I personally evaluated the patient during the encounter.    EKG:      59 year old male comes in a chief complaint of flank pain.  CT scan of the kidney stone ordered and it is negative.  Patient has no abdominal tenderness.  He is noted to have pain in the lumbar region, that is not pleuritic and we do not think there is underlying PE.  We will focus on pain control with conservative measures.

## 2020-04-26 NOTE — ED Triage Notes (Signed)
Formatting of this note might be different from the original.  Pt reports right flank pain x 3 days. States urine has been dark. 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

## 2020-06-21 NOTE — ED Triage Notes (Signed)
Formatting of this note might be different from the original.  3-4 day H/O polyuria. Dysuria and intermittent blood in urine.  No fever or chills   Electronically signed by Gara Kroner, RN at 06/21/2020 10:15 AM EST

## 2020-06-21 NOTE — ED Provider Notes (Signed)
Formatting of this note is different from the original.    History     Chief Complaint     Dysuria     Christopher Peters here with dysuria. Report 3-4 day H/O polyuria. Dysuria and intermittent blood in urine.  No fever or chills. Hx of nephroctomy d/t 60+ renal stones.  Has severe pain.  No cp/sob/n/v.     History provided by: patient.   Nursing note reviewed and I agree with the documentation of the past medical, past surgical, social, and family histories.Vitals reviewed.  Pertinent negatives include no chills, no nausea, no vomiting and no flank pain.     No past medical history on file.    No past surgical history on file.    No family history on file.    Social History     Tobacco Use   ? Smoking status: Not on file   ? Smokeless tobacco: Not on file   Substance Use Topics   ? Alcohol use: Not on file   ? Drug use: Not on file     Allergies: Patient has no allergy information on record.    Prior to Admission medications    Medication Sig   cephalexin (KEFLEX) 500 MG capsule Take 1 capsule (500 mg total) by mouth 4 (four) times a day for 10 days     Review of Systems   Constitutional: Negative for chills and fever.   HENT: Negative for ear pain and sinus pain.    Eyes: Negative for pain.   Respiratory: Negative for chest tightness, shortness of breath and wheezing.    Cardiovascular: Negative for chest pain and leg swelling.   Gastrointestinal: Negative for abdominal pain, constipation, nausea and vomiting.   Genitourinary: Positive for dysuria. Negative for flank pain.   Musculoskeletal: Negative for back pain, myalgias and neck pain.   Skin: Negative for color change and pallor.   Neurological: Negative for dizziness, seizures, weakness and headaches.   Hematological: Negative for adenopathy.     Physical Exam     Vital signs upon initiating note  BP 174/94   Pulse 65   Temp 97.8 F (36.6 C)   Resp 18   SpO2 97%     Physical Exam  Vitals and nursing note reviewed.   Constitutional:       Appearance: He is  well-developed.   HENT:      Head: Normocephalic and atraumatic.   Eyes:      Pupils: Pupils are equal, round, and reactive to light.   Cardiovascular:      Rate and Rhythm: Normal rate and regular rhythm.      Heart sounds: Normal heart sounds.   Pulmonary:      Effort: Pulmonary effort is normal. No respiratory distress.      Breath sounds: Normal breath sounds. No wheezing or rales.   Abdominal:      General: Bowel sounds are normal. There is no distension.      Palpations: Abdomen is soft.      Tenderness: There is no abdominal tenderness.   Musculoskeletal:         General: Normal range of motion.      Cervical back: Normal range of motion and neck supple.   Skin:     General: Skin is warm and dry.   Neurological:      Mental Status: He is alert and oriented to person, place, and time.   Psychiatric:  Behavior: Behavior normal.     Lab Results:   Results for orders placed or performed during the hospital encounter of 06/21/20   Basic metabolic panel    Specimen: Serum; Blood   Result Value Ref Range    Sodium,S 137 136 - 145 mmol/L    Potassium 4.3 3.5 - 5.1 mmol/L    Chloride 100 98 - 107 mmol/L    CO2 24 22 - 29 mmol/L    Glucose 124 (H) 70 - 99 mg/dL    BUN 12 6 - 20 mg/dL    CREATININE,S 5.46 0.7 - 1.2 mg/dL    ANION GAP 17 12 - 20    CALCIUM,TOTAL 9.9 8.6 - 10.0 mg/dL    GFR Non-Afric Amer 69 >59 ml/min/1.73 m2    GFR AFRICAN AMER 80 >59 ml/min/1.73 m2   CBC and differential    Specimen: Blood   Result Value Ref Range    WBC COUNT 7.74 3.50 - 10.50 10E9/L    RBC Count 5.41 4.32 - 5.72 10E12/L    HGB 17.1 13.5 - 17.5 g/dL    Hematocrit 27.0 (H) 39.0 - 50.0 %    MCV 95.4 (H) 81.0 - 95.0 fL    MCH 31.6 26.0 - 34.0 pg    MCHC 33.1 32.0 - 36.0 g/dL    RDW 35.0 09.3 - 81.8 %    PLATELET 170 150 - 450 10E9/L    MPV 11.4 9.4 - 12.4 fL    % Immature Granulocytes 0.4 %    % NEUTROPHILS 67.4 %    % Lymphs 23.3 %    % Monos 5.0 %    % EOS 3.4 %    % BASOS 0.5 %    Absolute Immature Granulocytes 0.03 0.00 - 0.10  10E9/L    Absolute Neutrophils 5.22 1.70 - 7.00 10E9/L    Absolute Lymphs 1.80 1.50 - 4.00 10E9/L    Absolute Monos 0.39 0.30 - 0.90 10E9/L    Absolute EOS 0.26 0.10 - 0.50 10E9/L    Absolute Baso 0.04 0.00 - 0.30 10E9/L    NRBCS 0.0 (A) REFERENCE RANGE NOT ESTABLISHED /100 WBC   Urinalysis w/ Reflex to Microscopic, clean catch   Result Value Ref Range    URINE COLOR YELLOW YELLOW    URINE APPEARANCE CLEAR CLEAR    URINE SPEC GRAVITY 1.020 1.001 - 1.040    Urine pH 5.0 5.0 - 8.0    Urine Protein NEGATIVE NEGATIVE    URINE GLUCOSE,Iris NEGATIVE NEGATIVE    URINE KETONES TRACE (A) NEGATIVE    URINE BILIRUBIN NEGATIVE NEGATIVE    URINE BLOOD NEGATIVE NEGATIVE    URINE NITRITES NEGATIVE NEGATIVE    URINE LEUK ESTERASE NEGATIVE NEGATIVE    UROBILINOGEN <2.0 (A) NEGATIVE    Ascorbic Acid NEGATIVE NEGATIVE    URINE PRESERVATIVE NO     Urine Refrigerated NO          Imaging results:   Results for orders placed or performed during the hospital encounter of 06/21/20   CT Abdomen/Pelvis without IV Contrast    Narrative    EXAM:  NF CT ABDOMEN/PELVIS W/O IV CONTRAST    CLINICAL INDICATION: Flank pain, kidney stone suspected  right flank/RLQ pain/dysuria.  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    TECHNIQUE:  CT scan of the abdomen and pelvis with multiplanar reformatted images generated from the data set without IV contrast. Dose reduction techniques were utilized.     COMPARISON: No comparison studies  are available at this time.    FINDINGS:     The imaged lung bases are clear.    The liver, gallbladder, pancreas, spleen, and adrenal glands show no acute abnormality. The left kidney is not visualized and may be surgically absent. No obstructive uropathy on the right. There is a 2 mm nonobstructing right renal calculus. There is a   3.7 cm low-density lesion in the right kidney, may be a cyst.    Suboptimal assessment of the gastrointestinal tract without oral contrast. No dilated fluid-filled small bowel loops to suggest an  obstructive process. There is mild colonic diverticulosis without evidence of diverticulitis. The appendix is normal in   appearance. This is not a substitute for EGD or colonoscopy.    No pelvic free fluid. There are mild aortoiliac vascular calcifications. No abdominal aortic aneurysm. Vessel patency is not evaluated on this noncontrast CT. No pathologically enlarged lymph nodes.    No destructive osseous lesion.    There is a small to moderate sized fat-containing supraumbilical hernia. There are too smaller fat-containing supraumbilical hernias more cranially..     Impression    .         Marland Kitchen    1.  No obstructive uropathy. Right nephrolithiasis.  2.  Other findings as detailed above.    Released By: Marlane Mingle, MD 06/21/2020 12:36 PM     ED Course     60 yo with hematuria/flank pain .    Appropriate orders entered and plan to start labs/radiology at this time.     At this time will initiate orders including:  Orders Placed This Encounter   Procedures   ? CT Abdomen/Pelvis without IV Contrast   ? Basic metabolic panel   ? CBC and differential   ? Urinalysis w/ Reflex to Microscopic, clean catch     1:40 PM  No acute findings.  Possibly passed stone but no signs of obstructive uropathy.  Afebrile/NAD.  Pain resolved and patient declines pain medicine.  Will start on Keflex for presumed early uti but urine clean.  F/u with Dr. Salvadore Oxford, patient's urologist.  RTED for any new/worsening issues.     This care is provided during an unprecedented national emergency due to the Novel Coronavirus (COVID-19). COVID-19 infections and transmission risks place heavy strains on healthcare resources. As this pandemic evolves, the Hospital and providers strive to respond fluidly, to remain operational, and to provide care relative to available resources and information. Outcomes are unpredictable and treatments are without well-defined guidelines. Further, the impact of COVID-19 on all aspects of emergency care, including the  impact to patients seeking care for reasons other than COVID-19, is unavoidable during this national emergency.    1:41 PM    The patient?s work up is complete, and there is no further need for emergent diagnostics or treatments. Patient is stable, nontoxic, and has vital signs without concerns. Exam is non-focal except as noted above. I have gone over the discharge instructions and reviewed the warning signs/symptoms requiring immediate re-evaluation. Pt has also received instructions, and contact information, for outpatient follow up. The patient (or the patient?s advocate in the ED) verbalized understanding of everything discussed, and is ready for discharge.     Diagnoses this encounter:  Final diagnoses:   Flank pain   Dysuria     Medications Given in ED:  Medications   morphine injection 2 mg/mL (4 mg Intravenous Given 06/21/20 1302)   ondansetron (PF) (ZOFRAN) injection 2 mg/mL (4 mg Intravenous  Given 06/21/20 1302)   sodium chloride 0.9% (NS) bolus 1,000 mL (1,000 mL Intravenous NS Bolus 06/21/20 1303)     Medications prescribed this encounter:  New Prescriptions    CEPHALEXIN (KEFLEX) 500 MG CAPSULE    Take 1 capsule (500 mg total) by mouth 4 (four) times a day for 10 days     Patient to follow up as directed with:  Derinda Sis, MD  [address]  [place] [address]  [phone]    Call     Procedures : : :              No consult orders placed this encounter    Last four vital signs    06/21/20 1019   BP: 174/94   Pulse: 65   Resp: 18   Temp: 97.8 F (36.6 C)   SpO2: 97%     Electronically signed by      Carmelina Noun, PA  06/21/20 1341    Electronically signed by Catalina Pizza, MD at 06/21/2020  3:24 PM EST    Associated attestation - Catalina Pizza, MD - 06/21/2020  3:24 PM EST  Formatting of this note might be different from the original.  I have reviewed the assessment of the midlevel provider through chart review or direct discussion with the midlevel.  The midlevel has acted under my  guidance whether through my direct medical decision making or via protocols or both.  Pertinent diagnostics and chart reviewed.  The midlevel has recorded the findings for which I concur. See their chart for details.

## 2020-08-09 NOTE — ED Triage Notes (Signed)
Formatting of this note might be different from the original.  Pt arrives to ED w/ c/o R flank pain starting a few days ago. No n/v or hematuria. Pt has multiple visits over the past year witgh similar complaints and negative ct scans. Pt has hx of L nephrectomy.  Electronically signed by Erskine Speed, Registered Nurse at 08/09/2020 10:48 AM EDT

## 2020-08-09 NOTE — ED Provider Notes (Signed)
Formatting of this note is different from the original.  [facility] Emergency Department  ED Encounter Arrival Date: 08/09/20 [place]                            DOB: March 01, 1961  [address]  [place] [address]   MRN: [id number]   CSN: [id number]     HAR: [id number]     [place]    Chief Complaint   Patient presents with   ? Flank Pain     HPI    Christopher Peters is a 60 year old male who presents with right flank pain.  Reports intermittent sharp right flank pain starting a few days ago.  The pain is nonradiating, 10/10 intensity.  He has prior history of kidney stones and UTI.  He does admit to dysuria.  Denies fevers.  No vomiting.  Has tried over-the-counter medications without improvement.  Has had prior left nephrectomy.    PAST MEDICAL HISTORY    Past Medical History:   Diagnosis Date   ? Hypertension    ? Kidney stones      SURGICAL HISTORY    Past Surgical History:   Procedure Laterality Date   ? NEPHRECTOMY Left      CURRENT MEDICATIONS    Prior to Admission medications    Medication Sig Start Date End Date Taking? Authorizing Provider   atenolol (TENORMIN) 50 MG TABS Take 50 mg by mouth daily.    HISTORICAL MED   vitamin D (CHOLECALCIFEROL) 1000 units (25 mcg) TABS Take 1,000 Units by mouth daily.    HISTORICAL MED   lidocaine (LIDODERM) 5 % PTCH 1 patch by Transdermal route daily. 12/15/19   Jamal Maes., PA-C   methocarbamol (ROBAXIN) 750 MG TABS Take 1 tablet by mouth 3 (three) times daily as needed. 12/15/19   Jamal Maes., PA-C     ALLERGIES    Allergies   Allergen Reactions   ? Codeine Hives   ? Tramadol Other (See Comments)     Due to kidney     FAMILY HISTORY    History reviewed. No pertinent family history.    SOCIAL HISTORY    Social History     Socioeconomic History   ? Marital status: Married   Tobacco Use   ? Smoking status: Current Every Day Smoker     Packs/day: 1.00   ? Smokeless tobacco: Former Dance movement psychotherapist and Sexual Activity   ? Alcohol use: Yes     Comment: occasional   ? Drug use: Never     REVIEW OF SYSTEMS    Constitutional: Negative for chills or fever  HENT: Negative for sore throat.    Eyes: Negative for visual disturbance   Respiratory: Negative for shortness of breath.    Cardiovascular: Negative for palpitations.   Gastrointestinal: Positive for flank pain  Genitourinary: Positive for dysuria  Musculoskeletal: Negative for back pain.   Skin: Negative for rash.   Neurological: Negative for focal weakness  All systems negative except as marked.     PHYSICAL EXAM    VITAL SIGNS: BP (!) 181/79   Pulse 89   Temp 97.9 F (36.6 C) (Temporal)   Resp 17   SpO2 98%    Constitutional: Cooperative  HENT:  Normocephalic, Atraumatic, Bilateral external ears normal, Oropharynx moist, Nose normal. Neck- Normal range of motion  Eyes:  PERRL, EOMI, Conjunctiva normal, No discharge.   Respiratory:  Normal breath sounds, No respiratory distress, No wheezing  Cardiovascular:  Normal heart rate, Normal rhythm, No murmurs, No rubs, No gallops.   GI:  Bowel sounds normal, Soft, obese, no peritoneal signs  GU: right CVA tenderness.   Musculoskeletal:  Good range of motion in all major joints  Integument:  Warm, Dry, No erythema, No rash.   Neurologic:  Alert & oriented x 3    LABORATORY  Recent Results (from the past 24 hour(s))   CBC with Differential    Collection Time: 08/09/20 11:46 AM   Result Value Ref Range    WBC 6.3 3.6 - 10.5 THOU/mcL    RBC 4.71 4.40 - 5.80 MIL/mcL    HEMOGLOBIN 15.6 13.5 - 16.5 g/dL    HEMATOCRIT 44.7 40 - 50 %    MCV 95.0 82 - 97 fL    MCH 33.1 (H) 27 - 33 pg    MCHC 34.8 32 - 36 g/dL    RDW 14.5 12.3 - 17.0 %    PLATELET 140 140 - 375 THOU/mcL    MPV 9.0 7.4 - 11.5 fL    ABS. NEUTROPHIL 4.30 1.80 - 7.70 THOU/mcL    ABS LYMPHS 1.40 1.00 - 4.00 THOU/mcL    ABS MONOS 0.40 0.20 - 0.90 THOU/mcL    ABS EOS 0.20 0.03 - 0.45 THOU/mcL    ABS BASOS 0.10 0.00 - 0.20 THOU/mcL    SEGS 68 %     LYMPHOCYTES 22 %    MONOCYTES 6 %    EOSINOPHIL 3 %    BASOPHILS 1 %   CMP (BMP,TP,ALB,TBIL,ALK,AST,ALT)    Collection Time: 08/09/20 11:46 AM   Result Value Ref Range    SODIUM 139 135 - 145 mEq/L    POTASSIUM 3.9 3.6 - 5.1 mEq/L    CHLORIDE 104 98 - 111 mEq/L    CO2 25 21 - 31 mmol/L    GLUCOSE, RANDOM 132 (H) 70 - 99 mg/dL    BLD UREA NITROGEN 12 8 - 26 mg/dL    CREATININE 1.21 0.70 - 1.30 mg/dL    CALCIUM 9.4 8.5 - 10.4 mg/dL    TOTAL PROTEIN 7.2 6.0 - 8.0 g/dL    ALBUMIN 4.3 3.5 - 5.7 g/dL    TOTAL BILIRUBIN 0.9 0.0 - 1.2 mg/dL    ALK PHOSPHATASE 67 35 - 135 IU/L    AST 27 10 - 40 IU/L    ALT 58 10 - 60 IU/L    ESTIMATED GFR 69 >59 mL/min/1.73 m2    ANION GAP 10 6 - 18 mmol/L   Lipase    Collection Time: 08/09/20 11:46 AM   Result Value Ref Range    LIPASE 72 11 - 82 U/L   Urinalysis with Reflex to Culture    Collection Time: 08/09/20 11:46 AM    Specimen: Urine, Clean Catch   Result Value Ref Range    UA SPECIMEN SOURCE Urine, Clean Catch     URINE TYPE Urine     COLOR Yellow Yellow    APPEARANCE, URINE Clear Clear    PH, URINE 5.5 5.0 - 8.0    SPEC. GRAVITY, URINE 1.020 1.005 - 1.029    PROTEIN, URINE Trace (10-20 mg/dL) (A) NEGATIVE    GLUCOSE, URINE NEGATIVE NEGATIVE    KETONE, URINE NEGATIVE NEGATIVE    BILIRUBIN, URINE NEGATIVE NEGATIVE    BLOOD, URINE NEGATIVE NEGATIVE    UROBILINOGEN, URINE  1+ (2-3 mg/dL) (A) NORMAL mg/dL    NITRITE, URINE NEGATIVE NEGATIVE    LEUKOCYTE ESTERASE, URINE NEGATIVE NEGATIVE    RBC, URINE 0 <6 /HPF    WBC, URINE 0 to 5 0 to 5 /HPF    HYALINE CASTS 0 to 2 0 /LPF    BACTERIA NONE NONE /HPF    MUCUS PRESENT      RADIOLOGY    CT ABDOMEN PELVIS WO CONTRAST   Final Result       No acute abnormality on CT of abdomen and pelvis.     Postsurgical changes of left nephrectomy. No right renal stones or hydronephrosis.                   ED COURSE & MEDICAL DECISION MAKING    Pertinent Labs & Imaging studies reviewed. (See chart for details)    The patient was given the following  medication in the Emergency department:    Medication Administration from 08/09/2020 1040 to 08/09/2020 1224     Date/Time Order Dose Route Action Action by Comments    08/09/2020 1220 sodium chloride 0.9 % IV bolus 500 mL 500 mL Intravenous New Bag Toni Arthurs     08/09/2020 1220 HYDROmorphone (DILAUDID) injection 1 mg 1 mg Intravenous Given Toni Arthurs     08/09/2020 1221 ondansetron (ZOFRAN) injection 4 mg 4 mg Intravenous Given Toni Arthurs        60 year old male with right flank pain has history of kidney stone, pain has been intermittent for the last few days.  Labs show normal white blood cell count, stable renal panel, and negative urinalysis.  CT abdomen pelvis is negative.  Possible musculoskeletal etiology, give dose of pain medicine here.  OARRS report was reviewed    Patient was given the signs and symptoms of worsening condition and told to return if they occurred.  Patient understands the plan and management and all questions were answered.  Patient will follow up with his primary care physician in the next 2-3 days.  Patient was instructed on Culture results and preliminary radiology reads and the he would be contacted if a variance or positive test is obtained.  The nursing note was reviewed, agreed and appreciated.      Final Diagnosis:    1. Acute right flank pain      The patient was given the following medications to go home with:      Medication List     START taking these medications    hydrocodone-acetaminophen 5-325 MG Tabs  Commonly known as: NORCO  Take 1 tablet by mouth every 6 (six) hours as needed for Moderate Pain (4-6) or Severe Pain (7-10) for up to 6 doses.      CHANGE how you take these medications    methocarbamol 750 MG Tabs  Commonly known as: ROBAXIN  Take 1 tablet by mouth 3 (three) times daily for 15 doses.  What changed:    when to take this   reasons to take this      ASK your doctor about these medications    atenolol 50 MG Tabs  Commonly known as:  TENORMIN    lidocaine 5 % Ptch  Commonly known as: LIDODERM  1 patch by Transdermal route daily.    vitamin D 1000 units (25 mcg) Tabs  Commonly known as: CHOLECALCIFEROL        Where to Get Your Medications     These medications [facility] to [id number]  [place], [address] [place] [address]    Phone: [phone]    hydrocodone-acetaminophen 5-325 MG Tabs   methocarbamol 750 MG Tabs      Follow-up Information     Follow up With Specialties Details Why Contact Info    Other, Physician Jenetta Loges, MD Internal Medicine Schedule an appointment as soon as possible for a visit   Other        Dr. Kenton Kingfisher spent face to face time with the patient and agrees with the diagnosis and treatment       Sofie Hartigan., PA-C  08/09/20 1225      Macario Golds, MD  08/28/20 1139    Electronically signed by Macario Golds, MD at 08/28/2020 11:39 AM EDT    Associated attestation - Macario Golds, MD - 08/28/2020 11:39 AM EDT  Formatting of this note might be different from the original.  [place] NOTE  Patient has a chief complaint of right flank pain   General patient is in no apparent distress  HEENT:mucous membranes are moist  Eyes: no scleral icterus  Neck: the neck was supple  Cardiovascular: heart was regular rate and rhythm  Respiratory: lungs are clear to auscultation bilaterally  Abdominal: abdomen is nontender and nondistended  Musculoskeletal: no deformities are noted  Integument: no rashes are noted  Neurologic: patient is awake and alert  Psychiatric: patient does not appear to be anxious        I have personally seen and examined this patient. I have fully participated in the care of this patient with the resident/MLP.  I have reviewed and agree with all pertinent clinical information including history, physical exam, labs, radiographic studies and the plan. I have also reviewed and agree with the medications, allergies and past  medical history sections for this patient.    I have performed the substantive part of the Physical Exam in this patient encounter.    CT abdomen showed no signs of kidney stones      Electronically signed by:   Macario Golds, MD

## 2020-12-13 NOTE — ED Provider Notes (Signed)
Formatting of this note is different from the original.  Patient: Christopher Peters  MR# [id number]  Time of Service: 12/13/2020  1:46 PM    History     Chief Complaint   Patient presents with   ? Nephrolithiasis     Christopher Peters is a 60 y.o. 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  No other alleviating or aggravating factors, no other associated symptoms  - O/w denies any recent illness, URI, CP, SOB, bowel sxs    Patient's PMD is Outside Md    Past Medical History:   Diagnosis Date   ? Essential (primary) hypertension      History reviewed. No pertinent family history.    Social History     Socioeconomic History   ? Marital status: Married     Spouse name: None   ? Number of children: None   ? Years of education: None   ? Highest education level: None   Tobacco Use   ? Smoking status: Current Every Day Smoker     Packs/day: 1.00     Years: 45.00     Pack years: 45.00   ? Smokeless tobacco: Former Estate agent and Sexual Activity   ? Alcohol use: Not Currently   ? Drug use: Never   ? Sexual activity: Not Currently     Past Surgical History:   Procedure Laterality Date   ? KIDNEY REMOVAL      4 years ago, L kideny      Medication list on file  No current facility-administered medications for this encounter.     Current Outpatient Medications   Medication Sig Dispense Refill   ? Cholecalciferol (Vitamin D) 25 MCG (1000 UT) PO TABS Take 3,000 Units by mouth daily.       Allergies  No Known Allergies    ==============================================================    Review of Systems   Pertinent positives per HPI, otherwise all other systems reviewed and  negative.    ==============================================================    Physical Exam     Vitals:    12/13/20 1342   BP: 161/77   Pulse: 62   Resp: 18   Temp: 97.7 F (36.5 C)   TempSrc: Oral   SpO2: 97%   Weight: 275 lb (124.7 kg)   Height: 5\' 9"  (1.753 m)     Oxygen Saturation was 97% on RA at the time of the reading interpreted by me as normal.    Nursing note and vitals reviewed.  Constitutional:  Appears well-developed and well-nourished. No distress.   HEENT:   Head: Normocephalic  Nose: Nose normal.   Mouth/Throat: Oropharynx is clear and moist.   Eyes: Conjunctivae are normal. Pupils are equal, round, and reactive to light. No scleral icterus.   Ears: External ears without gross abnormalities  Neck: Normal range of motion. No tracheal deviation present.   Cardiovascular: Normal rate, regular rhythm, normal heart sounds. intact distal pulses.  Respiratory/Chest: Effort normal and breath sounds normal. No respiratory distress. No wheezes. No rales.   GI: obese, Soft. Exhibits no mass. No tenderness. Has no rebound and no guarding. No CVAT to palpation  Musculoskeletal: Exhibits no tenderness.   Neurological: Is awake and alert, strength  and sensation grossly intact  Skin: Skin is warm and dry. No edema  Psych: Appropriate mood and affect    ==============================================================    [facility] Course/MDM     Diagnostics/Procedures:    Smoking Cessation Counseling  Smoking cessation counseling provided for 3-5 minutes.  Discussed impact on his current health as well as long-term risks to his health and possible options for quitting.  He will consider.  ==============================================================    Laboratory summary    Labs Reviewed   CBC WITH DIFFERENTIAL - Abnormal; Notable for the following components:       Result Value    RBC 4.47 (*)     HCT 41.9 (*)     MCH 32.7 (*)     PLT 126 (*)     All other components within normal limits   COMPREHENSIVE METABOLIC PANEL -  Abnormal; Notable for the following components:    ANION GAP 8.0 (*)     All other components within normal limits   MAGNESIUM   URINALYSIS, WITH BACTERIOLOGY SCREEN     ==============================================================    Radiography/Imaging    CT ABDOMEN PELVIS PLAIN   [facility] Interpretation   Radiologist:  August Albino, MD    Findings:      4 cm cyst right kidney. No hydronephrosis. 2 mm non obstructing calculi inferior calyces right kidney. Left kidney not visualized surgically removed.  Appendix OK, rest OK.        ==============================================================    Medical Decision Making    Assessment:  Christopher Peters is a 60 y.o. 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   Radiographic studies ordered and reviewed as above.  Past medical records were reviewed and revealed: As above.   I discussed with the patient and/or family about the patient's care plan. All available questions answered.    ADMINISTERED MEDS IN [facility]:  Medications   Sodium Chloride 0.9 % BOLUS/FLUSH 1,000 mL (0 mLs Intravenous Stopped 12/13/20 1613)   morphine 4mg /mL injection (8 mg Intravenous Given 12/13/20 1436)     Consults placed  Treatment Team     Not on file       =============================================================    Disposition    The patient was discharged  to home from the Emergency Department in good condition.    Scripts provided to the patient:  New Prescriptions    No medications on file     Patient instructed and strongly encouraged to return immediately to the [facility] Department for persistent, recurrent or worsening symptoms.  Instructions to notify the Primary Care Provider and arrange additional follow-up were advised.  Written aftercare and follow-up instructions were discussed with and verbally acknowledged by the patient.    FINAL CLINICAL IMPRESSION  SNOMED CT(R)   1. Renal colic  RENAL COLIC         Electronically signed by Trixie Dredge, MD at 12/13/2020  4:55 PM CDT

## 2020-12-13 NOTE — ED Notes (Signed)
Formatting of this note might be different from the original.  Blood work drawn and sent to lab.   Electronically signed by Ouida Sills, RN at 12/13/2020  2:36 PM CDT

## 2020-12-13 NOTE — ED Triage Notes (Signed)
Formatting of this note might be different from the original.  Pt ambulatory to ED w/ complaint of "I think I have a kidney stone". Has hx of kidney stones. Had L kidney removed due to having over 60 kidney stones, so pt only has R kidney. Pt is having pain on back, around kidney area. Pain is intermitted. Rates pain 8/10. Has been taking tylenol for pain, says pain options limited d/t only having 1 kidney. Pt is A&O x 4,pt ambulatory, VS stable.   Electronically signed by Ouida Sills, RN at 12/13/2020  1:41 PM CDT

## 2021-01-02 IMAGING — CT CT RENAL STONE PROTOCOL
2 of 4 series · 17 of 46 positions shown, 19 images · non-contrast
Comparison: 06/13/2019, 06/17/2018

CLINICAL DATA: Right flank pain for 3 days

EXAM:
CT ABDOMEN AND PELVIS WITHOUT CONTRAST
TECHNIQUE: Multidetector CT imaging of the abdomen and pelvis was performed
following the standard protocol without IV contrast.

[Series 2: axial st · axial · 0.98mm/px · z∈[+693,+1148]mm · 14 of 101 slices shown, 16 images]
[im 5/101  soft-tissue]
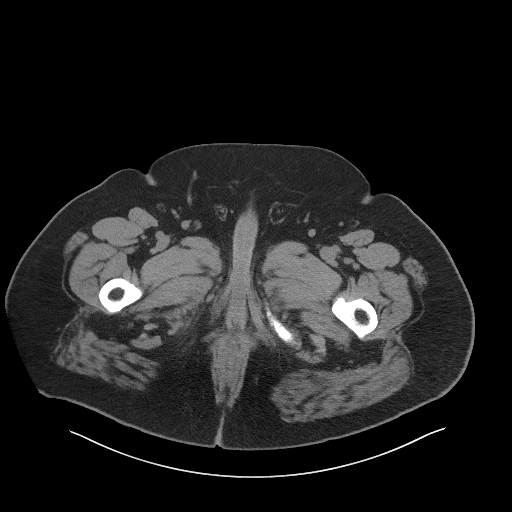
[im 5/101  bone]
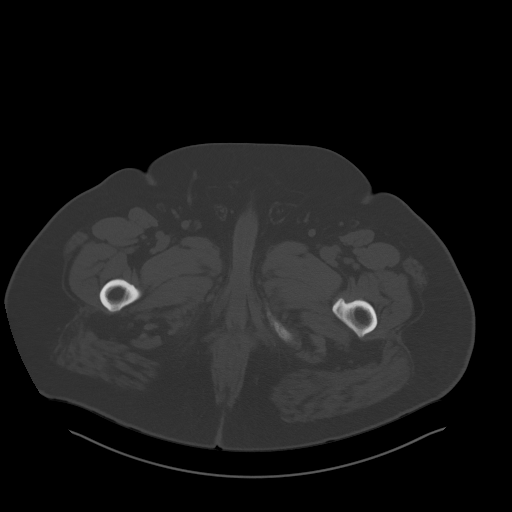
[im 14/101  soft-tissue]
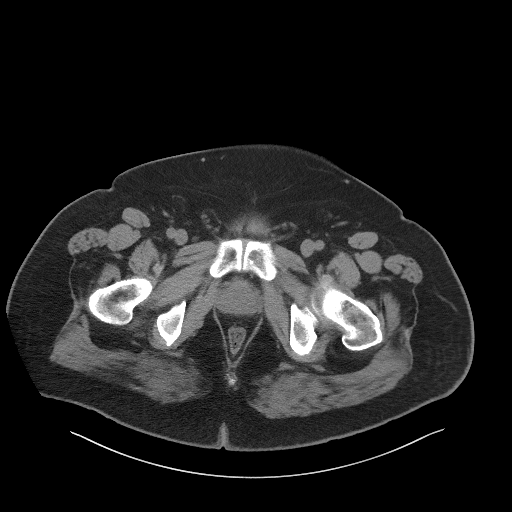
[im 18/101  soft-tissue]
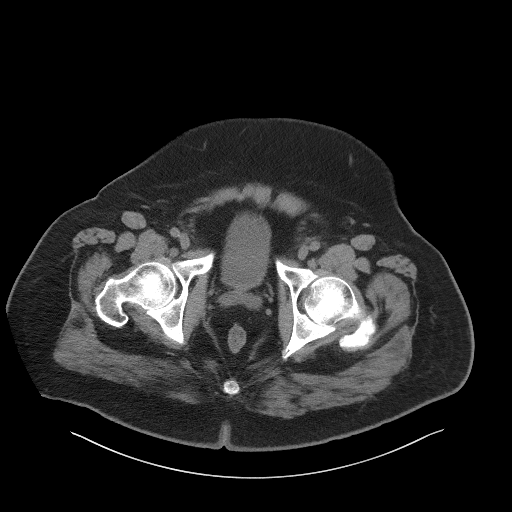
[im 27/101  soft-tissue]
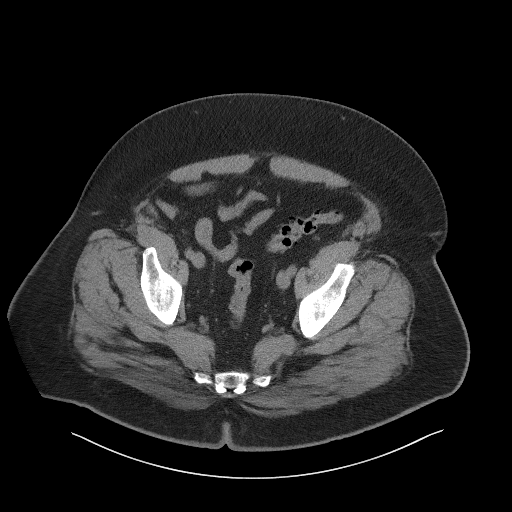
[im 35/101  soft-tissue]
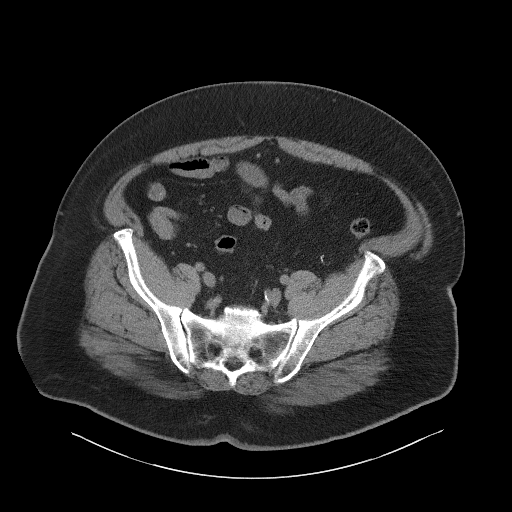
[im 40/101  soft-tissue]
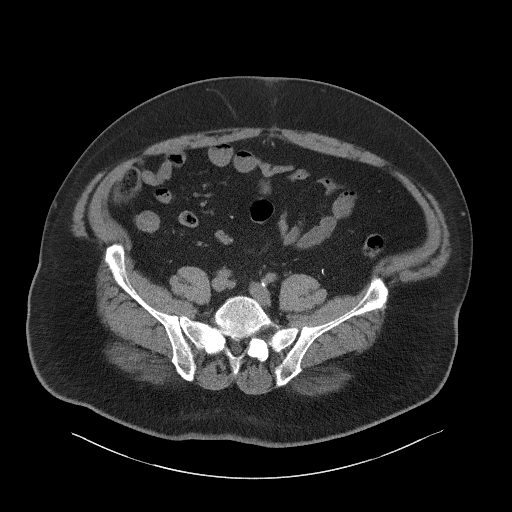
[im 48/101  soft-tissue]
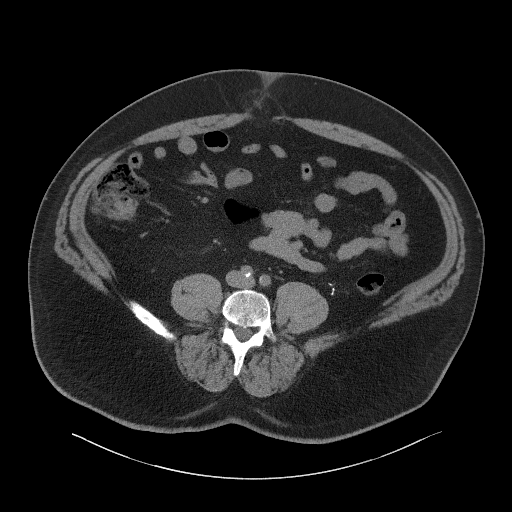
[im 53/101  soft-tissue]
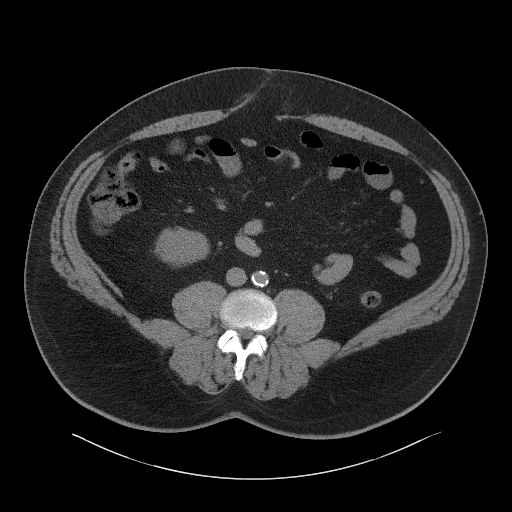
[im 61/101  soft-tissue]
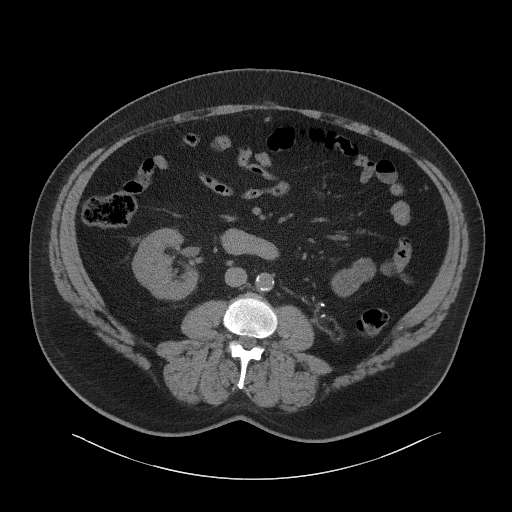
[im 61/101  bone]
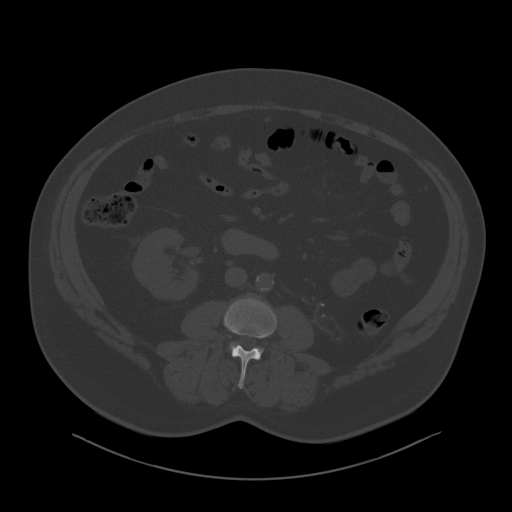
[im 66/101  soft-tissue]
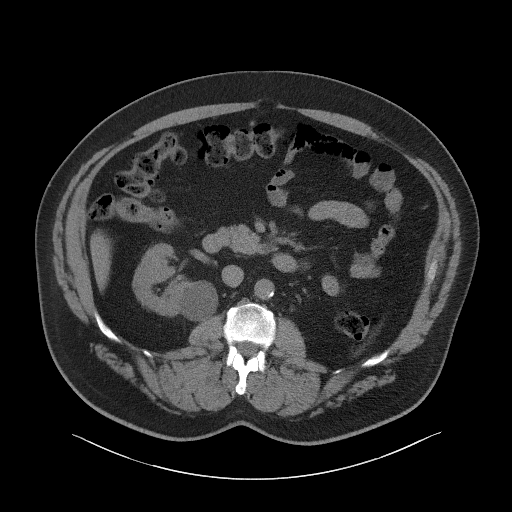
[im 74/101  soft-tissue]
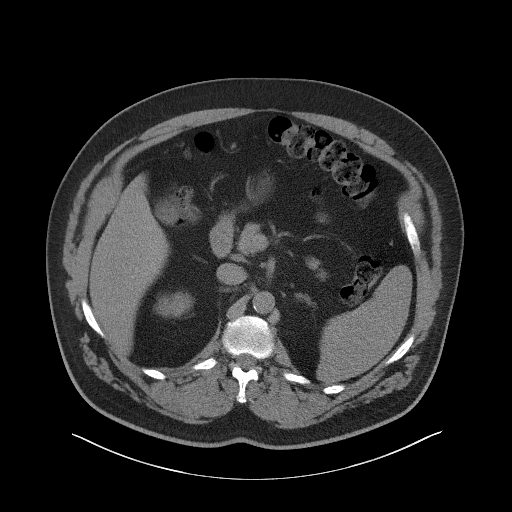
[im 83/101  soft-tissue]
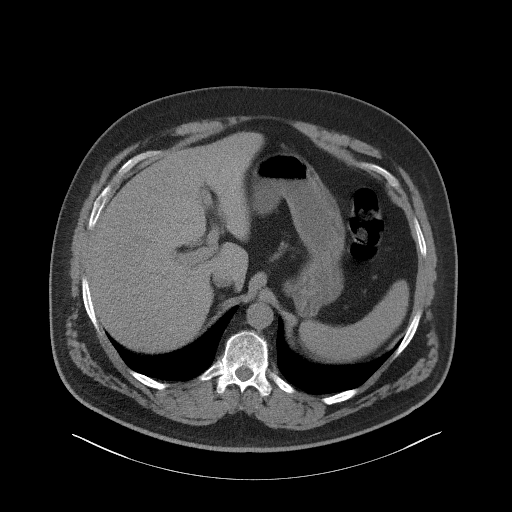
[im 87/101  soft-tissue]
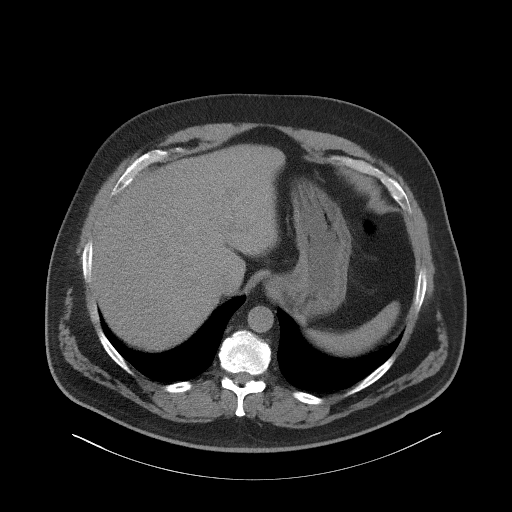
[im 96/101  soft-tissue]
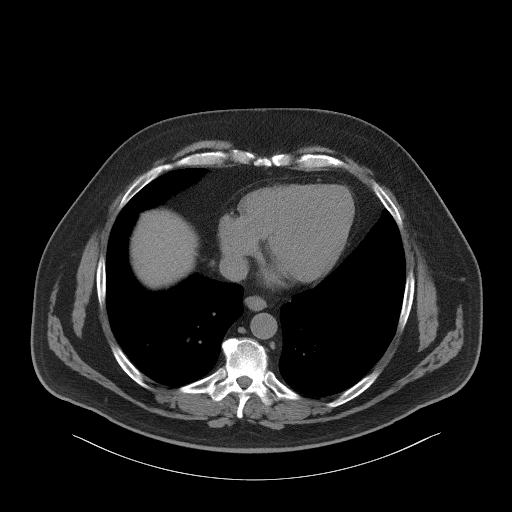

[Series 5: coronal st · coronal · 1.00mm/px · 3 of 132 slices shown]
[im 44/132  soft-tissue]
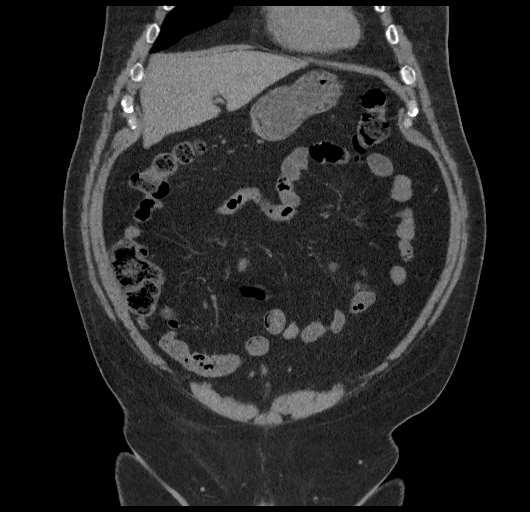
[im 59/132  soft-tissue]
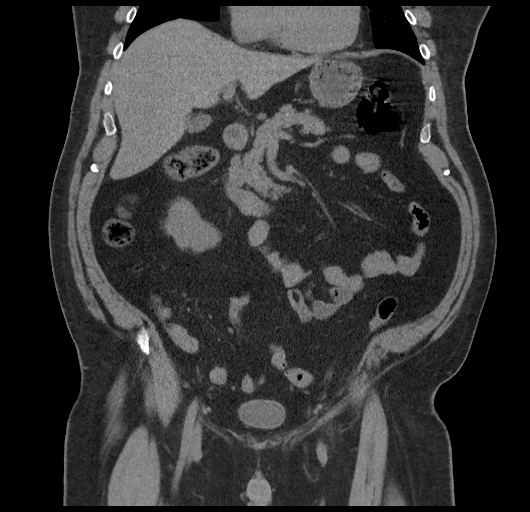
[im 73/132  soft-tissue]
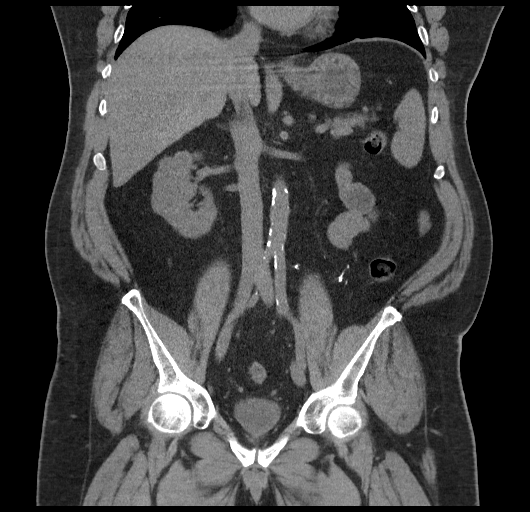

[17 of 46 positions shown; findings below may reference images not displayed]

FINDINGS: Lower chest: No acute abnormality.

Hepatobiliary: No focal liver abnormality is seen. No gallstones,
gallbladder wall thickening, or biliary dilatation.

Pancreas: Unremarkable. No pancreatic ductal dilatation or
surrounding inflammatory changes.

Spleen: Normal in size without focal abnormality.

Adrenals/Urinary Tract: Normal adrenal glands. Prior left
nephrectomy. 4 cm hypodense, fluid attenuating right posterior
interpolar renal mass most consistent with a cyst. No right
urolithiasis. No hydronephrosis. Normal bladder.

Stomach/Bowel: Stomach is within normal limits. Appendix appears
normal. No evidence of bowel wall thickening, distention, or
inflammatory changes. Diverticulosis of the sigmoid colon without
evidence of diverticulitis.

Vascular/Lymphatic: Normal caliber abdominal aorta with mild
atherosclerosis. No lymphadenopathy.

Reproductive: Prostate is unremarkable.

Other: Wide-mouth periumbilical fat containing hernia.

Musculoskeletal: No acute or significant osseous findings. Bone
island in the left acetabulum.
IMPRESSION: 1. No right urolithiasis or obstructive uropathy.
2. Prior left nephrectomy.
3. Diverticulosis without evidence of diverticulitis.
4. Aortic Atherosclerosis ([id number]).

## 2021-01-24 ENCOUNTER — Emergency Department
Admission: EM | Admit: 2021-01-24 | Discharge: 2021-01-24 | Disposition: A | Discharge status: Home / Self Care | Payer: Medicaid - Out of State | Attending: Emergency Medicine | Admitting: Emergency Medicine

## 2021-01-24 DIAGNOSIS — R109 Unspecified abdominal pain: Secondary | ICD-10-CM | POA: Insufficient documentation

## 2021-01-24 DIAGNOSIS — Z87442 Personal history of urinary calculi: Secondary | ICD-10-CM | POA: Insufficient documentation

## 2021-01-24 DIAGNOSIS — N200 Calculus of kidney: Secondary | ICD-10-CM | POA: Insufficient documentation

## 2021-01-24 DIAGNOSIS — N281 Cyst of kidney, acquired: Secondary | ICD-10-CM | POA: Insufficient documentation

## 2021-01-24 HISTORY — DX: Essential (primary) hypertension: I10

## 2021-01-24 HISTORY — DX: Calculus of kidney: N20.0

## 2021-01-24 LAB — COMPREHENSIVE METABOLIC PANEL
ALT: 35 U/L (ref 0–55)
AST (SGOT): 23 U/L (ref 5–34)
Albumin/Globulin Ratio: 1.5 (ref 0.9–2.2)
Albumin: 3.9 g/dL (ref 3.5–5.0)
Alkaline Phosphatase: 60 U/L (ref 37–117)
Anion Gap: 10 (ref 5.0–15.0)
BUN: 9 mg/dL (ref 9.0–28.0)
Bilirubin, Total: 0.7 mg/dL (ref 0.2–1.2)
CO2: 24 mEq/L (ref 22–29)
Calcium: 9.4 mg/dL (ref 8.5–10.5)
Chloride: 107 mEq/L (ref 100–111)
Creatinine: 1.2 mg/dL (ref 0.7–1.3)
Globulin: 2.6 g/dL (ref 2.0–3.6)
Glucose: 91 mg/dL (ref 70–100)
Potassium: 4.2 mEq/L (ref 3.5–5.1)
Protein, Total: 6.5 g/dL (ref 6.0–8.3)
Sodium: 141 mEq/L (ref 136–145)

## 2021-01-24 LAB — URINALYSIS REFLEX TO MICROSCOPIC EXAM - REFLEX TO CULTURE
Bilirubin, UA: NEGATIVE
Blood, UA: NEGATIVE
Glucose, UA: NEGATIVE
Ketones UA: NEGATIVE
Leukocyte Esterase, UA: NEGATIVE
Nitrite, UA: NEGATIVE
Protein, UR: NEGATIVE
Specific Gravity UA: 1.02 (ref 1.001–1.035)
Urine pH: 5 (ref 5.0–8.0)
Urobilinogen, UA: 4 mg/dL — AB (ref 0.2–2.0)

## 2021-01-24 LAB — GFR: EGFR: >60

## 2021-01-24 LAB — CBC AND DIFFERENTIAL
Absolute NRBC: 0 10*3/uL (ref 0.00–0.00)
Basophils Absolute Automated: 0.04 10*3/uL (ref 0.00–0.08)
Basophils Automated: 0.6 %
Eosinophils Absolute Automated: 0.17 10*3/uL (ref 0.00–0.44)
Eosinophils Automated: 2.5 %
Hematocrit: 44.6 % (ref 37.6–49.6)
Hgb: 15.8 g/dL (ref 12.5–17.1)
Immature Granulocytes Absolute: 0.02 10*3/uL (ref 0.00–0.07)
Immature Granulocytes: 0.3 %
Lymphocytes Absolute Automated: 1.98 10*3/uL (ref 0.42–3.22)
Lymphocytes Automated: 29.1 %
MCH: 32.7 pg (ref 25.1–33.5)
MCHC: 35.4 g/dL (ref 31.5–35.8)
MCV: 92.3 fL (ref 78.0–96.0)
MPV: 10.9 fL (ref 8.9–12.5)
Monocytes Absolute Automated: 0.55 10*3/uL (ref 0.21–0.85)
Monocytes: 8.1 %
Neutrophils Absolute: 4.04 10*3/uL (ref 1.10–6.33)
Neutrophils: 59.4 %
Nucleated RBC: 0 /100 WBC (ref 0.0–0.0)
Platelets: 121 10*3/uL — ABNORMAL LOW (ref 142–346)
RBC: 4.83 10*6/uL (ref 4.20–5.90)
RDW: 13 % (ref 11–15)
WBC: 6.8 10*3/uL (ref 3.10–9.50)

## 2021-01-24 LAB — LIPASE: Lipase: 24 U/L (ref 8–78)

## 2021-01-24 MED ORDER — ONDANSETRON 4 MG PO TBDP
4.0000 mg | ORAL_TABLET | Freq: Three times a day (TID) | ORAL | 0 refills | Status: AC | PRN
Start: 2021-01-24 — End: ?

## 2021-01-24 MED ORDER — OXYCODONE-ACETAMINOPHEN 5-325 MG PO TABS
1.0000 | ORAL_TABLET | ORAL | 0 refills | Status: AC | PRN
Start: 2021-01-24 — End: ?

## 2021-01-24 MED ORDER — HYDROMORPHONE HCL 1 MG/ML IJ SOLN
1.0000 mg | Freq: Once | INTRAMUSCULAR | Status: AC
Start: 2021-01-24 — End: 2021-01-24
  Administered 2021-01-24: 1 mg via INTRAVENOUS
  Filled 2021-01-24: qty 1

## 2021-01-24 MED ORDER — TAMSULOSIN HCL 0.4 MG PO CAPS
0.4000 mg | ORAL_CAPSULE | Freq: Every day | ORAL | 0 refills | Status: AC
Start: 2021-01-24 — End: ?

## 2021-01-24 MED ORDER — ONDANSETRON HCL 4 MG/2ML IJ SOLN
4.0000 mg | Freq: Once | INTRAMUSCULAR | Status: AC
Start: 2021-01-24 — End: 2021-01-24
  Administered 2021-01-24: 4 mg via INTRAVENOUS
  Filled 2021-01-24: qty 2

## 2021-01-24 NOTE — Discharge Instructions (Signed)
#######################################      Return immediately to the emergency room if you have any worsening symptoms!!! Otherwise, see urology doctor in the next 2 days, or return to the emergency department in 2 days if you have any trouble getting an appointment.       #######################################          Thank you for choosing [facility] for your emergency care needs.  We strive to provide EXCELLENT care to you and your family.      If you do not continue to improve or your condition worsens, please contact your doctor or return immediately to the Emergency Department.    The examination and treatment you have received in our Emergency Department is provided on an emergency basis, and is not intended to be a substitute for your primary care physician.  It is important that your doctor checks you again and that you report any new or remaining problems at that time.      DOCTOR REFERRALS  Call [phone] (available 24 hours a day, 7 days a week) if you need any further referrals and we can help you find a primary care doctor or specialist.  Also, available online at:  [email or web address]    YOUR CONTACT INFORMATION  Before leaving please check with registration to make sure we have an up-to-date contact number.  You can call registration at [phone] to update your information.  For questions about your hospital bill, please call [phone].  For questions about your Emergency Dept Physician bill please call [phone].      FREE HEALTH SERVICES  If you need help with health or social services, please call 2-1-1 for a free referral to resources in your area.  2-1-1 is a free service connecting people with information on health insurance, free clinics, pregnancy, mental health, dental care, food assistance, housing, and substance abuse counseling.  Also, available online at:  [email or web address]    MEDICAL RECORDS AND TESTS  Certain laboratory  test results do not come back the same day, for example urine cultures.   We will contact you if other important findings are noted.  Radiology films are often reviewed again to ensure accuracy.  If there is any discrepancy, we will notify you.      Please call [phone] to pick up a complimentary CD of any radiology studies performed.  If you or your doctor would like to request a copy of your medical records, please call [phone].      ORTHOPEDIC INJURY   Please know that significant injuries can exist even when an initial x-ray is read as normal or negative.  This can occur because some fractures (broken bones) are not initially visible on x-rays.  For this reason, close outpatient follow-up with your primary care doctor or bone specialist (orthopedist) is required.    MEDICATIONS AND FOLLOWUP  Please be aware that some prescription medications can cause drowsiness.  Use caution when driving or operating machinery.    24 HOUR PHARMACIES  [facility] - [address], [place], [place] [address] (1.4 miles, 7 minutes)  [facility] - [address], [place], [place] [address] (6.5 miles, 13 minutes)  A handout with directions is available on request.    PATIENT RELATIONS  If you have any concerns, issues or feedback with your care, positive or negative, please do not hesitate to contact Patient Relations at [phone]. They are open from 8:30AM-5:00PM Monday through Friday.

## 2021-01-24 NOTE — ED Triage Notes (Signed)
right sided flank pain, "it's a kidney stone, I've had over 60 of them." also has dark urine

## 2021-01-24 NOTE — EDIE (Signed)
COLLECTIVE?NOTIFICATION?01/24/2021 10:55?Larose Hires J?MRN: [id number]    Criteria Met      3 Different Facilities in 90 Days    5 ED Visits in 12 Months    Security and Safety  No Security Events were found.  ED Care Guidelines  There are currently no ED Care Guidelines for this patient. Please check your facility's medical records system.        Prescription Monitoring Program  150??- Narcotic Use Score  090??- Sedative Use Score  000??- Stimulant Use Score  390??- Overdose Risk Score  - All Scores range from 000-999 with 75% of the population scoring < 200 and on 1% scoring above 650  - The last digit of the narcotic, sedative, and stimulant score indicates the number of active prescriptions of that type  - Higher Use scores correlate with increased prescribers, pharmacies, mg equiv, and overlapping prescriptions  - Higher Overdose Risk Scores correlate with increased risk of unintentional overdose death   Concerning or unexpectedly high scores should prompt a review of the PMP record; this does not constitute checking PMP for prescribing purposes.    E.D. Visit Count (12 mo.)  Facility Visits   [facility] 1   [facility] 2   [facility] 1   [facility] 1   [facility] 1   Total 6   Note: Visits indicate total known visits.     Recent Emergency Department Visit Summary  Date Facility [facility] Type Diagnoses or Chief Complaint    Jan 24, 2021  [facility]  [place]  [place]  Emergency      Back Pain; Dark Urine      Flank Pain      Calculus of kidney      Unspecified abdominal pain      Jan 03, 2021  [facility]  [place]  [place]  Emergency  Chief Complaint: PT STS KIDNEY STONE    Dec 20, 2020  [facility] [place]  [name]  [place]  Emergency      BACK PAIN      Flank Pain      Unspecified abdominal pain      Other intervertebral disc degeneration, lumbar  region      Disorder of kidney and ureter, unspecified      Mar 29, 2020  [facility].  [place].  [place]  Emergency      Essential (primary) hypertension      Diverticulosis of large intestine without perforation or abscess without bleeding      Fatty (change of) liver, not elsewhere classified      Other intervertebral disc degeneration, lumbar region      Ventral hernia without obstruction or gangrene      Nicotine dependence, cigarettes, uncomplicated      Acquired absence of kidney      Allergy status to analgesic agent      Spondylosis without myelopathy or radiculopathy, lumbar region      Unspecified abdominal pain      Mar 01, 2020  [facility].  [place]  [place]  Emergency      KIDNEY STONE      Flank Pain      Feb 07, 2020  [facility] [place]  [name]  [place]  Emergency      Kidney stone  Flank Pain      Unspecified abdominal pain        Recent Inpatient Visit Summary  No Recent Inpatient Visits were found.  Care Team  No Care Team was found.  Collective Portal  This patient has registered at the [facility] Emergency Department   For more information visit: [email or web address]     PLEASE NOTE:     1.   Any care recommendations and other clinical information are provided as guidelines or for historical purposes only, and providers should exercise their own clinical judgment when providing care.    2.   You may only use this information for purposes of treatment, payment or health care operations activities, and subject to the limitations of applicable Collective Policies.    3.   You should consult directly with the organization that provided a care guideline or other clinical history with any questions about additional information or accuracy or completeness of information provided.    ? 2022 [place], [employer]. - [email or web address]

## 2021-01-24 NOTE — ED Provider Notes (Signed)
[facility] History and Physical Exam     Patient Name: David Oconnell, David Oconnell  Encounter Date:  01/24/2021  Attending Physician: Elray Mcgregor, MD  Patient DOB:  May 22, 1961  MRN:  [id number]  Room:  21/B21    Chief Complaint     Chief Complaint   Patient presents with    Flank Pain     History of Presenting Illness     60 y.o. male is visiting from [place].  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  He has had about 5 stones on his right side in the past, they have always passed spontaneously.  He is having right flank pain now that feels like prior kidney stones.   Moderate severity, dull quality ache, exacerbated with palpation, onset was rapid.  No dysuria, no feverishness.  Minimal nausea, no vomiting.  His urine has been dark-colored.        PMD:  Pcp, None, MD    Nursing Notes Review  Nursing Notes were reviewed.     Previous Records Review  Previous records were reviewed to the extent practicable for the current presentation.          Physical Exam     Vital Signs  BP 162/75   Pulse 61   Temp 98.2 F (36.8 C) (Oral)   Resp 20   Wt 128.6 kg   SpO2 96%     Review of Vital Signs  The patient's vital signs and oxygen saturation were reviewed and interpreted by me, Elray Mcgregor, MD.    Physical Exam    Constitutional: No acute distress. Coloration not ashen.   Eyes: No conjunctival discharge. EOMI.   Head, Ears, Nose, Mouth, Throat: Normocephalic. Moist mucous membranes.   Neck: Full range of motion. No obvious neck deformities. No tracheal deviation.   Cardiovascular: No obvious gallops. Normal capillary refill. Strong peripheral pulses.  Respiratory/Chest: No obvious chest wall asymmetry. No resp distress.   Gastrointestinal/Abdominal:  Soft abdomen. No abdominal distention.  No tenderness to palpation, no rigidity, no guarding. + bowel sounds. No peritoneal signs, benign exam.  Musculoskeletal:  Normal ROM and no focal extremity tenderness.   Back: No deformity. No significant scoliosis.  Mild right flank tenderness at the CVA.  Neurological: Alert. No acute focal deficits.   Skin: Warm skin. No acute rash. Not mottled.           Medical Decision Making     Hx & Exam Synthesis, Differential Diagnosis, Plan     Most likely kidney stone: We will avoid CT given his known history of kidney stones, and unlikely presence of other acute dangerous conditions.  He appears comfortable, will give medications for pain, will check UA and labs. If ED course is unremarkable will likely discharge with close follow-up.         ED Course, Monitors, EKG, Critical Care, Splints, Consults, Reevaluation, etc           ED Course as of 01/24/21 Rometta Emery Jan 24, 2021   1208 Feeling better. Sbp had risen to 180s but now 160s. Labs, ua neg. He says the dark urine is gone with the sample he gave. V unlikely to represent aortic path or other dangerous process. No blood in ua or hydro but based on his hx, stone is by far the most likely: will [place] c no ct at this  point, strict ret precs and close urology f/up.  [CM]   1210 I counseled extensively on nature of problem--voiced understanding, agrees to follow up. Given strict return precautions--fully understands:   Pt is to return immediately to emergency department if any worsening symptoms at all--otherwise, return to the emergency department within 2 days for a recheck or follow up with urology physician within 2 days.       [CM]   1218 Ed bedside u/s no r hydro (see media tab in chart review section for image). 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    Current Outpatient Medications:     atenolol (TENORMIN) 50 MG tablet, Take 50 mg  by mouth daily, Disp: , Rfl:     ondansetron (Zofran ODT) 4 MG disintegrating tablet, Take 1 tablet (4 mg total) by mouth every 8 (eight) hours as needed for Nausea, Disp: 8 tablet, Rfl: 0    oxyCODONE-acetaminophen (Percocet) 5-325 MG per tablet, Take 1 tablet by mouth every 4 (four) hours as needed for Pain Do not drive or use machinery for 8 hours after taking!, Disp: 12 tablet, Rfl: 0    tamsulosin (Flomax) 0.4 MG Cap, Take 1 capsule (0.4 mg total) by mouth daily, Disp: 7 capsule, Rfl: 0    Allergies     Allergies   Allergen Reactions    Tylenol With Codeine #3 [Acetaminophen-Codeine] Hives       Medical history, medications, and allergies reviewed.     Past Surgical History     Past Surgical History:   Procedure Laterality Date    NEPHRECTOMY RADICAL Left        Family History   The family history is not significantly contributory to current presentation.   History reviewed. No pertinent family history.    Social History   Social history is not significantly contributory to the patient's current presentation.   Social History     Occupational History    Not on file   Tobacco Use    Smoking status: Every Day     Packs/day: 0.50     Types: Cigarettes    Smokeless tobacco: Former     Types: Catering manager Use: Never used   Substance and Sexual Activity    Alcohol use: Yes     Comment: one or two beers every few months    Drug use: Never    Sexual activity: Not on file       Review of Systems     See HPI for review of systems that is relevant to the current presentation.   All other systems reviewed: negative.     ED Medications Administered     ED Medication Orders (From admission, onward)      Start Ordered     Status Ordering Provider    01/24/21 1119 01/24/21 1118  HYDROmorphone (DILAUDID) injection 1 mg  Once        Route: Intravenous  Ordered Dose: 1 mg     Last MAR action: Given Kerry Odonohue N    01/24/21 1119 01/24/21 1118  ondansetron (ZOFRAN) injection 4 mg  Once        Route:  Intravenous  Ordered Dose: 4 mg     Last MAR action: Given Eugean Arnott N            Orders Placed During This Encounter     Orders Placed This Encounter   Procedures    CBC and differential    Comprehensive metabolic panel    Lipase    Urinalysis Reflex to Microscopic Exam- Reflex to Culture    GFR    Saline lock IV       Diagnostic Study Results     The results of the diagnostic studies below were reviewed by the ED provider:    Labs  Results       Procedure Component Value Units Date/Time    GFR [[id number]] Collected: 01/24/21 1135     Updated: 01/24/21 1200     EGFR >60.0       Narrative:      [place] [id number] at 01/24/2021 11:34 Reason: Printed by   mistake/Printing Issues.    Comprehensive metabolic panel [[id number]] Collected: 01/24/21 1135    Specimen: Blood Updated: 01/24/21 1200     Glucose 91 mg/dL      BUN 9.0 mg/dL      Creatinine 1.2 mg/dL      Sodium 621 mEq/L      Potassium 4.2 mEq/L      Chloride 107 mEq/L      CO2 24 mEq/L      Calcium 9.4 mg/dL      Protein, Total 6.5 g/dL      Albumin 3.9 g/dL      AST (SGOT) 23 U/L      ALT 35 U/L      Alkaline Phosphatase 60 U/L      Bilirubin, Total 0.7 mg/dL      Globulin 2.6 g/dL      Albumin/Globulin Ratio 1.5     Anion Gap 10.0    Narrative:      Rescheduled by [id number] at 01/24/2021 11:34 Reason: Printed by   mistake/Printing Issues.    Lipase [[id number]] Collected: 01/24/21 1135    Specimen: Blood Updated: 01/24/21 1200     Lipase 24 U/L     Narrative:      Rescheduled by [id number] at 01/24/2021 11:34 Reason: Printed by   mistake/Printing Issues.    CBC and differential [[id number]]  (Abnormal) Collected: 01/24/21 1135    Specimen: Blood Updated: 01/24/21 1147     WBC 6.80 x10 3/uL      Hgb 15.8 g/dL      Hematocrit 32.4 %      Platelets 121 x10 3/uL      RBC 4.83 x10 6/uL      MCV 92.3 fL      MCH 32.7 pg      MCHC 35.4 g/dL      RDW 13 %      MPV 10.9 fL      Neutrophils 59.4 %      Lymphocytes Automated 29.1 %      Monocytes 8.1 %      Eosinophils  Automated 2.5 %      Basophils Automated 0.6 %      Immature Granulocytes 0.3 %      Nucleated RBC 0.0 /100 WBC      Neutrophils Absolute 4.04 x10 3/uL      Lymphocytes Absolute Automated 1.98 x10 3/uL  Monocytes Absolute Automated 0.55 x10 3/uL      Eosinophils Absolute Automated 0.17 x10 3/uL      Basophils Absolute Automated 0.04 x10 3/uL      Immature Granulocytes Absolute 0.02 x10 3/uL      Absolute NRBC 0.00 x10 3/uL     Narrative:      Rescheduled by [id number] at 01/24/2021 11:34 Reason: Printed by   mistake/Printing Issues.    Urinalysis Reflex to Microscopic Exam- Reflex to Culture [[id number]]  (Abnormal) Collected: 01/24/21 1135    Specimen: Urine, Clean Catch Updated: 01/24/21 1145     Urine Type Urine, Clean Ca     Color, UA Yellow     Clarity, UA Clear     Specific Gravity UA 1.020     Urine pH 5.0     Leukocyte Esterase, UA Negative     Nitrite, UA Negative     Protein, UR Negative     Glucose, UA Negative     Ketones UA Negative     Urobilinogen, UA 4.0 mg/dL      Bilirubin, UA Negative     Blood, UA Negative    Narrative:      Rescheduled by [id number] at 01/24/2021 11:34 Reason: Printed by   mistake/Printing Issues.            Radiologic Studies  Radiology Results (24 Hour)       ** No results found for the last 24 hours. Lynford Humphrey and MD Attestations     Rendering Provider: Elray Mcgregor, MD          Diagnosis and Disposition     Clinical Impression  1. Kidney stone    2. Right flank pain        Disposition  ED Disposition       ED Disposition   Discharge    Condition   --    Date/Time   Sun Jan 24, 2021 12:17 PM    Comment   Dewitte Vannice [facility] discharge to home/self care.    Condition at disposition: Stable                     Prescriptions       Discharge Medication List as of 01/24/2021 12:17 PM        START taking these medications    Details   ondansetron (Zofran ODT) 4 MG disintegrating tablet Take 1 tablet (4 mg total) by mouth every 8 (eight) hours as needed for Nausea,  Starting Sun 01/24/2021, E-Rx      oxyCODONE-acetaminophen (Percocet) 5-325 MG per tablet Take 1 tablet by mouth every 4 (four) hours as needed for Pain Do not drive or use machinery for 8 hours after taking!, Starting Sun 01/24/2021, Normal      tamsulosin (Flomax) 0.4 MG Cap Take 1 capsule (0.4 mg total) by mouth daily, Starting Sun 01/24/2021, E-Rx                Elray Mcgregor, MD  01/24/21 [phone]

## 2021-02-01 ENCOUNTER — Emergency Department: Payer: Medicaid - Out of State

## 2021-02-01 ENCOUNTER — Emergency Department
Admission: EM | Admit: 2021-02-01 | Discharge: 2021-02-01 | Disposition: A | Discharge status: Home / Self Care | Payer: Medicaid - Out of State | Attending: Emergency Medicine | Admitting: Emergency Medicine

## 2021-02-01 DIAGNOSIS — I10 Essential (primary) hypertension: Secondary | ICD-10-CM | POA: Insufficient documentation

## 2021-02-01 DIAGNOSIS — F17210 Nicotine dependence, cigarettes, uncomplicated: Secondary | ICD-10-CM | POA: Insufficient documentation

## 2021-02-01 DIAGNOSIS — R109 Unspecified abdominal pain: Secondary | ICD-10-CM | POA: Insufficient documentation

## 2021-02-01 LAB — COMPREHENSIVE METABOLIC PANEL
ALT: 26 U/L (ref 0–55)
AST (SGOT): 20 U/L (ref 5–41)
Albumin/Globulin Ratio: 1.3 (ref 0.9–2.2)
Albumin: 3.8 g/dL (ref 3.5–5.0)
Alkaline Phosphatase: 68 U/L (ref 37–117)
Anion Gap: 10 (ref 5.0–15.0)
BUN: 9 mg/dL (ref 9.0–28.0)
Bilirubin, Total: 0.8 mg/dL (ref 0.2–1.2)
CO2: 23 mEq/L (ref 17–29)
Calcium: 9.4 mg/dL (ref 8.5–10.5)
Chloride: 106 mEq/L (ref 99–111)
Creatinine: 1.1 mg/dL (ref 0.5–1.5)
Globulin: 2.9 g/dL (ref 2.0–3.6)
Glucose: 94 mg/dL (ref 70–100)
Potassium: 4.4 mEq/L (ref 3.5–5.3)
Protein, Total: 6.7 g/dL (ref 6.0–8.3)
Sodium: 139 mEq/L (ref 135–145)

## 2021-02-01 LAB — CBC AND DIFFERENTIAL
Absolute NRBC: 0 10*3/uL (ref 0.00–0.00)
Basophils Absolute Automated: 0.06 10*3/uL (ref 0.00–0.08)
Basophils Automated: 0.8 %
Eosinophils Absolute Automated: 0.23 10*3/uL (ref 0.00–0.44)
Eosinophils Automated: 3.2 %
Hematocrit: 46 % (ref 37.6–49.6)
Hgb: 16.1 g/dL (ref 12.5–17.1)
Immature Granulocytes Absolute: 0.02 10*3/uL (ref 0.00–0.07)
Immature Granulocytes: 0.3 %
Lymphocytes Absolute Automated: 2 10*3/uL (ref 0.42–3.22)
Lymphocytes Automated: 27.5 %
MCH: 32.5 pg (ref 25.1–33.5)
MCHC: 35 g/dL (ref 31.5–35.8)
MCV: 92.7 fL (ref 78.0–96.0)
MPV: 10.8 fL (ref 8.9–12.5)
Monocytes Absolute Automated: 0.48 10*3/uL (ref 0.21–0.85)
Monocytes: 6.6 %
Neutrophils Absolute: 4.47 10*3/uL (ref 1.10–6.33)
Neutrophils: 61.6 %
Nucleated RBC: 0 /100 WBC (ref 0.0–0.0)
Platelets: 130 10*3/uL — ABNORMAL LOW (ref 142–346)
RBC: 4.96 10*6/uL (ref 4.20–5.90)
RDW: 13 % (ref 11–15)
WBC: 7.26 10*3/uL (ref 3.10–9.50)

## 2021-02-01 LAB — URINALYSIS REFLEX TO MICROSCOPIC EXAM - REFLEX TO CULTURE
Bilirubin, UA: NEGATIVE
Blood, UA: NEGATIVE
Glucose, UA: NEGATIVE
Ketones UA: NEGATIVE
Leukocyte Esterase, UA: NEGATIVE
Nitrite, UA: NEGATIVE
Protein, UR: NEGATIVE
Specific Gravity UA: >=1.03 (ref 1.001–1.035)
Urine pH: 6 (ref 5.0–8.0)
Urobilinogen, UA: 0.2 mg/dL (ref 0.2–2.0)

## 2021-02-01 LAB — GFR: EGFR: >60

## 2021-02-01 LAB — LIPASE: Lipase: 21 U/L (ref 8–78)

## 2021-02-01 MED ORDER — ONDANSETRON 4 MG PO TBDP
4.0000 mg | ORAL_TABLET | Freq: Three times a day (TID) | ORAL | 0 refills | Status: AC | PRN
Start: 2021-02-01 — End: ?

## 2021-02-01 MED ORDER — HYDROMORPHONE HCL 1 MG/ML IJ SOLN
1.0000 mg | Freq: Once | INTRAMUSCULAR | Status: AC
Start: 2021-02-01 — End: 2021-02-01
  Administered 2021-02-01: 14:00:00 1 mg via INTRAVENOUS
  Filled 2021-02-01: qty 1

## 2021-02-01 MED ORDER — HYDROMORPHONE HCL 2 MG PO TABS
2.0000 mg | ORAL_TABLET | Freq: Four times a day (QID) | ORAL | 0 refills | Status: AC | PRN
Start: 2021-02-01 — End: ?

## 2021-02-01 MED ORDER — HYDROMORPHONE HCL 1 MG/ML IJ SOLN
1.0000 mg | Freq: Once | INTRAMUSCULAR | Status: AC
Start: 2021-02-01 — End: 2021-02-01
  Administered 2021-02-01: 13:00:00 1 mg via INTRAVENOUS
  Filled 2021-02-01: qty 1

## 2021-02-01 MED ORDER — ONDANSETRON HCL 4 MG/2ML IJ SOLN
4.0000 mg | Freq: Once | INTRAMUSCULAR | Status: AC
Start: 2021-02-01 — End: 2021-02-01
  Administered 2021-02-01: 13:00:00 4 mg via INTRAVENOUS
  Filled 2021-02-01: qty 2

## 2021-02-01 NOTE — EDIE (Signed)
COLLECTIVE?NOTIFICATION?02/01/2021 11:49?David Oconnell?MRN: [id number]    Criteria Met      3 Different Facilities in 90 Days    5 ED Visits in 12 Months    Security and Safety  No Security Events were found.  ED Care Guidelines  There are currently no ED Care Guidelines for this patient. Please check your facility's medical records system.        Prescription Monitoring Program  150??- Narcotic Use Score  090??- Sedative Use Score  000??- Stimulant Use Score  390??- Overdose Risk Score  - All Scores range from 000-999 with 75% of the population scoring < 200 and on 1% scoring above 650  - The last digit of the narcotic, sedative, and stimulant score indicates the number of active prescriptions of that type  - Higher Use scores correlate with increased prescribers, pharmacies, mg equiv, and overlapping prescriptions  - Higher Overdose Risk Scores correlate with increased risk of unintentional overdose death   Concerning or unexpectedly high scores should prompt a review of the PMP record; this does not constitute checking PMP for prescribing purposes.    E.D. Visit Count (12 mo.)  Facility Visits   [facility] 1   [facility] 2   [facility] 2   [facility] 1   [facility] 1   Total 7   Note: Visits indicate total known visits.     Recent Emergency Department Visit Summary  Date Facility [facility] Type Diagnoses or Chief Complaint    Feb 01, 2021  [facility]  [place]  [place]  Emergency      SOB      Jan 24, 2021  [facility]  [place]  [place]  Emergency      Back Pain; Dark Urine      Flank Pain      Calculus of kidney      Unspecified abdominal pain      Jan 03, 2021  [facility]  [place].  [place]  Emergency  Chief Complaint: PT STS KIDNEY STONE    Dec 20, 2020  [facility] [place].  [name]  [place]  Emergency      BACK PAIN      Flank Pain      Unspecified  abdominal pain      Other intervertebral disc degeneration, lumbar region      Disorder of kidney and ureter, unspecified      Mar 29, 2020  [facility].  [place].  [place]  Emergency      Essential (primary) hypertension      Diverticulosis of large intestine without perforation or abscess without bleeding      Fatty (change of) liver, not elsewhere classified      Other intervertebral disc degeneration, lumbar region      Ventral hernia without obstruction or gangrene      Nicotine dependence, cigarettes, uncomplicated      Acquired absence of kidney      Allergy status to analgesic agent      Spondylosis without myelopathy or radiculopathy, lumbar region      Unspecified abdominal pain      Mar 01, 2020  [facility].  [place].    Emergency      KIDNEY STONE      Flank Pain  Feb 07, 2020  [facility] [place].  [name]  [place]  Emergency      Kidney stone      Flank Pain      Unspecified abdominal pain        Recent Inpatient Visit Summary  No Recent Inpatient Visits were found.  Care Team  No Care Team was found.  Collective Portal  This patient has registered at the [facility] Emergency Department   For more information visit: [email or web address]     PLEASE NOTE:     1.   Any care recommendations and other clinical information are provided as guidelines or for historical purposes only, and providers should exercise their own clinical judgment when providing care.    2.   You may only use this information for purposes of treatment, payment or health care operations activities, and subject to the limitations of applicable Collective Policies.    3.   You should consult directly with the organization that provided a care guideline or other clinical history with any questions about additional information or accuracy or completeness of information provided.    ? 2022 [place],  [employer] - [email or web address]

## 2021-02-01 NOTE — ED Notes (Signed)
DSW or Walking Ability Test    DSW Is the patient able to perform the following function independently without weakness or dizziness?   D (dangle): Ask the patient able to sit on the side of the bed and dangle their legs with little to no assistance Yes   S (stand): Ask patient to stand up with little to no assistance Yes   W (walk): Ask patient to lift their feet completely off the floor 3-4 times Yes

## 2021-02-01 NOTE — Discharge Instructions (Signed)
#######################################      Return immediately to the emergency room if you have any worsening symptoms!!! Otherwise, see general doctor in the next 2 days, or return to the emergency department in 2 days if you have any trouble getting an appointment.       #######################################          Thank you for choosing [facility] for your emergency care needs.  We strive to provide EXCELLENT care to you and your family.      If you do not continue to improve or your condition worsens, please contact your doctor or return immediately to the Emergency Department.    The examination and treatment you have received in our Emergency Department is provided on an emergency basis, and is not intended to be a substitute for your primary care physician.  It is important that your doctor checks you again and that you report any new or remaining problems at that time.      DOCTOR REFERRALS  Call [phone] (available 24 hours a day, 7 days a week) if you need any further referrals and we can help you find a primary care doctor or specialist.  Also, available online at:  [email or web address]    YOUR CONTACT INFORMATION  Before leaving please check with registration to make sure we have an up-to-date contact number.  You can call registration at [phone] to update your information.  For questions about your hospital bill, please call [phone].  For questions about your Emergency Dept Physician bill please call [phone].      FREE HEALTH SERVICES  If you need help with health or social services, please call 2-1-1 for a free referral to resources in your area.  2-1-1 is a free service connecting people with information on health insurance, free clinics, pregnancy, mental health, dental care, food assistance, housing, and substance abuse counseling.  Also, available online at:  [email or web address].org    MEDICAL RECORDS AND TESTS  Certain laboratory  test results do not come back the same day, for example urine cultures.   We will contact you if other important findings are noted.  Radiology films are often reviewed again to ensure accuracy.  If there is any discrepancy, we will notify you.      Please call [phone] to pick up a complimentary CD of any radiology studies performed.  If you or your doctor would like to request a copy of your medical records, please call [phone].      ORTHOPEDIC INJURY   Please know that significant injuries can exist even when an initial x-ray is read as normal or negative.  This can occur because some fractures (broken bones) are not initially visible on x-rays.  For this reason, close outpatient follow-up with your primary care doctor or bone specialist (orthopedist) is required.    MEDICATIONS AND FOLLOWUP  Please be aware that some prescription medications can cause drowsiness.  Use caution when driving or operating machinery.    24 HOUR PHARMACIES  [facility] - [address], , [place] [address] (1.4 miles, 7 minutes)  [facility] - [address], [place], [place] [address] (6.5 miles, 13 minutes)  A handout with directions is available on request.    PATIENT RELATIONS  If you have any concerns, issues or feedback with your care, positive or negative, please do not hesitate to contact Patient Relations at [phone]. They are open from 8:30AM-5:00PM Monday through Friday.

## 2021-02-01 NOTE — ED Provider Notes (Signed)
Take care  [facility] History and Physical Exam     Patient Name: David Oconnell, David Oconnell  Encounter Date:  02/01/2021  Attending Physician: Elray Mcgregor, MD  Patient DOB:  1960-05-30  MRN:  [id number]  Room:  H 3/H 3    Chief Complaint     Chief Complaint   Patient presents with    Flank Pain     History of Presenting Illness     60 y.o. male presents with right flank pain.  I saw him on 4 September with right flank pain, he had had 5 kidney stones or so in his right kidney in the past, I did not do a CT scan given that the history clearly was suggestive of stone, and discharged him.  He was unable to pick up the medications I prescribed apparently because of a problem with his insurance and the pharmacy, but despite that his pain got a lot better.  But then today the flank pain came back.   Moderate to severe severity, dull quality ache, exacerbated with palpation, onset was rapid.  There is no left flank pain.  There is no abdominal pain.  No chest pain.  No nausea or vomiting.  No feverishness.  He does have mild dysuria, describing it as burning when he urinates.          PMD:  Pcp, None, MD    Nursing Notes Review  Nursing Notes were reviewed.     Previous Records Review  Previous records were reviewed to the extent practicable for the current presentation.          Physical Exam     Vital Signs  BP 148/81   Pulse (!) 59   Temp 98.2 F (36.8 C)   Resp 18   Wt 128.4 kg   SpO2 95%     Review of Vital Signs  The patient's vital signs and oxygen saturation were reviewed and interpreted by me, Elray Mcgregor, MD.    Physical Exam    Constitutional: No acute distress. Color is not ashen.   Eyes: No conjunctival discharge. EOMI.   Head, Ears, Nose, Mouth, Throat: Normocephalic. Moist mucous membranes.   Neck: Full range of motion. No obvious neck deformities. No tracheal deviation.   Cardiovascular: Normal peripheral pulses. Normal capillary refill. Regular  rhythm.  Respiratory/Chest: No obvious chest wall asymmetry. No resp distress. CTAB.  Gastrointestinal/Abdominal:  Soft abdomen. No distention. No TTP.  Musculoskeletal: Normal ROM, with no focal tenderness and no LEE.   Back: No deformity. No significant scoliosis.  There is no right CVA tenderness in the area of his pain.  Neurological: Alert. No acute focal deficits.   Skin: Warm, color not mottled. No acute rash.         Medical Decision Making     Hx & Exam Synthesis, Differential Diagnosis, Plan     Likely kidney stone: Given the continuing pain I ordered a CT scan to evaluate the size and location.  However, the CT was negative.  Labs and UA also unremarkable.      ED Course, Monitors, EKG, Critical Care, Splints, Consults, Reevaluation, etc           ED Course as of 02/01/21 2248   Mon Feb 01, 2021   1504 His CT noncontrast is negative, surprisingly given that his history is strongly suggestive of a kidney stone.  He does have high blood pressure, most likely attributable to the pain, but given that the  CT did not show any clear findings and was not done with contrast, I recommended to Mr. Masini that we do a CT angiogram to check his aorta to ensure no dissection.  Overall this is not likely but it is somewhere on the differential diagnosis given his continuing pain in his elevated blood pressure and the lack of explanation for his flank pain otherwise.  He says that his nephrologist told him to never get contrast because he had a left nephrectomy.  I believe that giving contrast is low risk because his kidney function is good and the risk of causing CIN is low.  He understands my concerns but still declines a CT angiogram.  Since the chance of aortic pathology acutely is low overall, I will acquiesce to his wishes and discharge him with close follow-up in the next few days for recheck. [CM]   1506 I counseled extensively on nature of problem--voiced understanding, agrees to follow up. Given strict  return precautions--fully understands:   Pt is to return immediately to emergency department if any worsening symptoms at all--otherwise, return to the emergency department within 2 days for a recheck or follow up with primary physician within 2 days.       [CM]      ED Course User Index  [CM] Elray Mcgregor, MD         Past Medical History     Past Medical History:   Diagnosis Date    Hypertension     Kidney stones        Medications     No current facility-administered medications for this encounter.    Current Outpatient Medications:     atenolol (TENORMIN) 50 MG tablet, Take 50 mg by mouth daily, Disp: , Rfl:     HYDROmorphone (Dilaudid) 2 MG tablet, Take 1 tablet (2 mg total) by mouth every 6 (six) hours as needed for Pain, Disp: 12 tablet, Rfl: 0    ondansetron (Zofran ODT) 4 MG disintegrating tablet, Take 1 tablet (4 mg total) by mouth every 8 (eight) hours as needed for Nausea, Disp: 8 tablet, Rfl: 0    ondansetron (Zofran ODT) 4 MG disintegrating tablet, Take 1 tablet (4 mg total) by mouth every 8 (eight) hours as needed for Nausea, Disp: 8 tablet, Rfl: 0    oxyCODONE-acetaminophen (Percocet) 5-325 MG per tablet, Take 1 tablet by mouth every 4 (four) hours as needed for Pain Do not drive or use machinery for 8 hours after taking!, Disp: 12 tablet, Rfl: 0    tamsulosin (Flomax) 0.4 MG Cap, Take 1 capsule (0.4 mg total) by mouth daily, Disp: 7 capsule, Rfl: 0    Allergies     Allergies   Allergen Reactions    Tylenol With Codeine #3 [Acetaminophen-Codeine] Hives       Medical history, medications, and allergies reviewed.     Past Surgical History     Past Surgical History:   Procedure Laterality Date    NEPHRECTOMY RADICAL Left        Family History   The family history is not significantly contributory to current presentation.   History reviewed. No pertinent family history.    Social History   Social history is not significantly contributory to the patient's current presentation.   Social History      Occupational History    Not on file   Tobacco Use    Smoking status: Every Day     Packs/day: 0.50     Types: Cigarettes  Smokeless tobacco: Former     Types: Catering manager Use: Never used   Substance and Sexual Activity    Alcohol use: Yes     Comment: one or two beers every few months    Drug use: Never    Sexual activity: Not on file       Review of Systems     See HPI for review of systems that is relevant to the current presentation.   All other systems reviewed: negative.     ED Medications Administered     ED Medication Orders (From admission, onward)      Start Ordered     Status Ordering Provider    02/01/21 1417 02/01/21 1416  HYDROmorphone (DILAUDID) injection 1 mg  Once        Route: Intravenous  Ordered Dose: 1 mg     Last MAR action: Given Eevee Borbon N    02/01/21 1231 02/01/21 1230  HYDROmorphone (DILAUDID) injection 1 mg  Once        Route: Intravenous  Ordered Dose: 1 mg     Last MAR action: Given Sidney Kann N    02/01/21 1231 02/01/21 1230  ondansetron (ZOFRAN) injection 4 mg  Once        Route: Intravenous  Ordered Dose: 4 mg     Last MAR action: Given Aubrey Blackard N            Orders Placed During This Encounter     Orders Placed This Encounter   Procedures    CT Abd/Pelvis without Contrast    CBC and differential    Comprehensive metabolic panel    Lipase    Urinalysis Reflex to Microscopic Exam- Reflex to Culture    GFR    Saline lock IV       Diagnostic Study Results     The results of the diagnostic studies below were reviewed by the ED provider:    Labs  Results       Procedure Component Value Units Date/Time    Urinalysis Reflex to Microscopic Exam- Reflex to Culture [[id number]] Collected: 02/01/21 1356    Specimen: Urine, Clean Catch Updated: 02/01/21 1418     Urine Type CC     Color, UA Yellow     Clarity, UA Clear     Specific Gravity UA >=1.030     Urine pH 6.0     Leukocyte Esterase, UA Negative     Nitrite, UA Negative     Protein, UR Negative      Glucose, UA Negative     Ketones UA Negative     Urobilinogen, UA 0.2 mg/dL      Bilirubin, UA Negative     Blood, UA Negative     RBC, UA 0 - 2 /hpf      WBC, UA 0 - 5 /hpf      Squamous Epithelial Cells, Urine 0 - 5 /hpf     Comprehensive metabolic panel [[id number]] Collected: 02/01/21 1210    Specimen: Blood Updated: 02/01/21 1238     Glucose 94 mg/dL      BUN 9.0 mg/dL      Creatinine 1.1 mg/dL      Sodium 782 mEq/L      Potassium 4.4 mEq/L      Chloride 106 mEq/L      CO2 23 mEq/L      Calcium 9.4 mg/dL      Protein, Total  6.7 g/dL      Albumin 3.8 g/dL      AST (SGOT) 20 U/L      ALT 26 U/L      Alkaline Phosphatase 68 U/L      Bilirubin, Total 0.8 mg/dL      Globulin 2.9 g/dL      Albumin/Globulin Ratio 1.3     Anion Gap 10.0    Lipase [[id number]] Collected: 02/01/21 1210    Specimen: Blood Updated: 02/01/21 1238     Lipase 21 U/L     GFR [[id number]] Collected: 02/01/21 1210     Updated: 02/01/21 1238     EGFR >60.0       CBC and differential [[id number]]  (Abnormal) Collected: 02/01/21 1210    Specimen: Blood Updated: 02/01/21 1226     WBC 7.26 x10 3/uL      Hgb 16.1 g/dL      Hematocrit 56.3 %      Platelets 130 x10 3/uL      RBC 4.96 x10 6/uL      MCV 92.7 fL      MCH 32.5 pg      MCHC 35.0 g/dL      RDW 13 %      MPV 10.8 fL      Neutrophils 61.6 %      Lymphocytes Automated 27.5 %      Monocytes 6.6 %      Eosinophils Automated 3.2 %      Basophils Automated 0.8 %      Immature Granulocytes 0.3 %      Nucleated RBC 0.0 /100 WBC      Neutrophils Absolute 4.47 x10 3/uL      Lymphocytes Absolute Automated 2.00 x10 3/uL      Monocytes Absolute Automated 0.48 x10 3/uL      Eosinophils Absolute Automated 0.23 x10 3/uL      Basophils Absolute Automated 0.06 x10 3/uL      Immature Granulocytes Absolute 0.02 x10 3/uL      Absolute NRBC 0.00 x10 3/uL             Radiologic Studies  Radiology Results (24 Hour)       Procedure Component Value Units Date/Time    CT Abd/Pelvis without Contrast [[id number]]  Collected: 02/01/21 1413    Order Status: Completed Updated: 02/01/21 1422    Narrative:      HISTORY:  Flank pain, kidney stone suspected    COMPARISON: None available.    TECHNIQUE: CT of the abdomen and pelvis performed without intravenous  contrast.     The following dose reduction techniques were utilized: automated  exposure control and/or adjustment of the mA and/or KV according to  patient size, and the use of an iterative reconstruction technique.    FINDINGS: Please note that evaluation of the viscera and vasculature is  limited in the absence of IV contrast.     Lower chest: Nonspecific 2 mm right middle lobe pulmonary nodule.  Otherwise, clear.    Hepatobiliary: Hepatic steatosis. No abnormal biliary distension.    Pancreas: Unremarkable. No abnormal pancreatic ductal dilation.    Spleen: Unremarkable.    Adrenals: Unremarkable.    Kidneys and ureters: Left nephrectomy. 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     No right hydronephrosis or urolithiasis. 4 cm cyst in the medial left  mid to upper pole kidney.    Bladder: Unremarkable.    Reproductive organs: Unremarkable.  Gastrointestinal: No bowel obstruction. No abnormal bowel wall  thickening. Incidental sigmoid diverticulosis.    Normal appendix.    Peritoneum/retroperitoneum: No abnormal free fluid. No loculated fluid  collection. No free intraperitoneal air.    Lymph nodes: No lymphadenopathy.    Vessels: The abdominal aorta is non-dilated. Moderate burden calcified  atherosclerosis.    Musculoskeletal: No acute or aggressive osseous abnormality.    Soft tissues: Fat-containing supraumbilical ventral wall hernia measures  up to approximately 4.6 cm across the base.      Impression:           1.  No acute abnormality. No obstructive uropathy or urolithiasis.  Patient is status post left nephrectomy.  2.  Incidental and chronic findings are detailed above.    Campbell Stall   02/01/2021 2:20 PM            Scribe and MD  Attestations     Rendering Provider: Elray Mcgregor, MD          Diagnosis and Disposition     Clinical Impression  1. Right flank pain        Disposition  ED Disposition       ED Disposition   Discharge    Condition   --    Date/Time   Mon Feb 01, 2021  3:06 PM    Comment   [facility] discharge to home/self care.    Condition at disposition: Stable                     Prescriptions       Discharge Medication List as of 02/01/2021  2:49 PM        START taking these medications    Details   HYDROmorphone (Dilaudid) 2 MG tablet Take 1 tablet (2 mg total) by mouth every 6 (six) hours as needed for Pain, Starting Mon 02/01/2021, Normal      !! ondansetron (Zofran ODT) 4 MG disintegrating tablet Take 1 tablet (4 mg total) by mouth every 8 (eight) hours as needed for Nausea, Starting Mon 02/01/2021, E-Rx       !! - Potential duplicate medications found. Please discuss with provider.             Elray Mcgregor, MD  02/01/21 2248

## 2021-02-07 NOTE — ED Provider Notes (Signed)
Formatting of this note is different from the original.  Images from the original note were not included.        [facility] EMERGENCY DEPARTMENT  [address]  [place] [address]  Dept: [phone]  Dept Fax: [phone]       ED Impression     1. Acute left-sided back pain, unspecified back location      Disposition     ED Disposition     ED Disposition   Discharge    Condition   Stable    Comment   --         New prescriptions given to patient:  New Prescriptions    METHOCARBAMOL (ROBAXIN-750) 750 MG TABLET    Take one tablet (750 mg dose) by mouth 4 (four) times daily for 24 doses.     ___________________________________________________________  ___________________________________________________________   ___________________________________________________________     HPI   From ED Nurse Staff Triage Note(s)  Back Pain [12] Pt. states Left sided back pain, s/p lifting a 5 gallon bucket, pt. states pain from cervical to lumbar     L sided mid/low back pain -"I was helping my brother and carrying a 5 gallon bucket of crack filler and I pulled a muscle in my back."    Christopher Peters is a 60 y.o. male PTED w/ left-sided mid and low back pain which began 3 days ago.  Patient has been taking cyclobenzaprine and also took a 5 mg Percocet that he had leftover from one of his kidney stones.  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  Movement markedly worsens the pain.  He has had similar pain several times in the past.    Pt denies  Antiplatelet or anticoagulant medications, incontinence, lower extremity weakness, radiculopathy, fever, chills    Patient's son will be driving.  He is followed by primary care physician down in [place].    Review of PMP reveals a total of 3 prescriptions.  All 3 are from emergency physicians, last prescription was 12 2 mg Dilaudid tablets.   I included [place] in the search.    PMD/Specialists   No care team member to display     Social Hx                              Social History     Tobacco Use   ? Smoking status: Current Every Day Smoker     Packs/day: 1.00     Types: Cigarettes   ? Smokeless tobacco: Never Used   Vaping Use   ? Vaping Use: Never used   Substance Use Topics   ? Alcohol use: Yes     Comment: Pt states last night he had 2 beers   ? Drug use: No     ROS   A complete review of systems was reviewed & negative or non-contributory to presentation except as noted in HPI  -Past Medical/Surgical/Social History reviewed by me (as documented by RN notes)     Physical Exam     General/Constitutional1:  Available VS reviewed, WD, Body mass index is 40.61 kg/m.,   Masked  uncomfortable appearing, worse with movement  well-appearing,   Eyes2: focus & track, sclera non-icteric,   Head: NC/AT  ENMT3: Hearing is adequate to conversational voice,  Neck: No visible JVD or tracheal deviation is noted.  Cardiovascular4: RRR,   not tachycardic,   Respiratory5: Pt is breathing easily, speaking in complete sentences, no respiratory distress,   no increased work of breathing,   equal breath sounds bilaterally, no wheezes, no rales, no rhonchi,     Gastrointestinal6: non distended, soft,  non tender  Musculoskeletal7: Nontender over the midline TLS spine, somewhat tender to palpation in the paraspinous musculature of the mid and low back without trigger point, no saddle anesthesia, normal sensation, normal strength to include toe dorsi flexion.  Integumentary8: warm, dry, observed areas are without discoloration or rash,   Neurologic9: Observed portions of the face are symmetric & fully expressive, No gross focal neuro deficits are appreciated  Psychologic10: The patient's mood, affect and interaction are appropriate to age, development and setting. awake & alert, cooperative, speech fluent,     Differential Diagnoses/MDM   Including but not limited to  -musculoskeletal back pain without evidence of cord compromise    ___________________________________________________________  ___________________________________________________________   ___________________________________________________________     Home Medications:      Current Home Medications    Medication Sig   albuterol sulfate HFA (PROVENTIL,VENTOLIN,PROAIR) 108 (90 Base) MCG/ACT inhaler Inhale one puff to two puffs into the lungs every 6 (six) hours as needed for Wheezing.   atenolol (TENORMIN) 50 mg tablet Take 50 mg by mouth daily.   clonazePAM (KLONOPIN) 1 mg tablet Take 1 mg by mouth 2 (two) times a day as needed for Anxiety.   methocarbamol (ROBAXIN-750) 750 mg tablet Take one tablet (750 mg dose) by mouth 4 (four) times daily for 24 doses.   VITAMIN D, ERGOCALCIFEROL, PO Take by mouth.     Allergies:    Allergies   Allergen Reactions   ? Acetaminophen-Codeine Itching   ? Codeine Itching   ? Ketorolac Other     Pt told not to take d/t kidney issues   ? Nsaids Other     Kidney removal      Patient PMHx/PSHx      Past Medical History:   Diagnosis Date   ? Hypertension    ? Kidney stone    ? Kidney stones      Past Surgical History:   Procedure Laterality Date   ? Nephrectomy Left 07/2016       ED Course     1:38 PM  Patient feels better, requesting discharge.  Updated pt/family on workup results.  All questions answered,    PMD followup enouraged.  RTEDP discussed.      Medications  (ED/RX)     Meds administered in ER this visit:  Medications   ketorolac tromethamine (TORADOL) 15 mg/mL injection 15 mg (15 mg Intramuscular Refused 02/07/21 1140)   droperidol (INAPSINE) injection 1.25 mg (1.25 mg Intramuscular Given 02/07/21 1137)   methocarbamol (ROBAXIN) tablet 1,000 mg (1,000 mg Oral Given 02/07/21 1141)         Labs     No visits with results within 1 Day(s) from this visit.   Latest known visit with results is:   Admission on 12/20/2020, Discharged on 12/20/2020   Component Date Value Ref Range Status    ? WBC 12/20/2020 6.2  5.1 - 10.8 thou/mcL Final   ? RBC 12/20/2020 4.65  4.05 - 5.64 million/mcL Final   ? HGB 12/20/2020 14.9  13.5 - 17.5 gm/dL Final   ? HCT 12/20/2020 43.7  40.5 - 52.5 % Final   ? MCV 12/20/2020 94  83 - 97 fL Final   ? MCH 12/20/2020 32.0  28.0 - 33.0 pg Final   ? MCHC 12/20/2020 34.1  32.0 - 36.0 gm/dL Final   ? Plt Ct 12/20/2020 151  150 - 400 thou/mcL Final   ? RDW SD 12/20/2020 45.8  36.0 - 47.0 fL Final   ? MPV 12/20/2020 10.7  8.9 - 11.0 fL Final   ? NRBC% 12/20/2020 0.0  0 /100WBC Final   ? NRBC 12/20/2020 0.000  0 thou/mcL Final   ? NEUTROPHIL % 12/20/2020 64.1  50.0 - 70.0 % Final   ? LYMPHOCYTE % 12/20/2020 24.7 (A) 25.0 - 40.0 % Final   ? MONOCYTE % 12/20/2020 7.1  4.0 - 12.0 % Final   ? Eosinophil % 12/20/2020 3.1  1.0 - 6.0 % Final   ? BASOPHIL % 12/20/2020 0.5  0.0 - 2.0 % Final   ? IG% 12/20/2020 0.500 (A) 0.001 - 0.429 % Final   ? ABSOLUTE NEUTROPHIL COUNT 12/20/2020 3.97  1.50 - 7.50 thou/mcL Final   ? ABSOLUTE LYMPHOCYTE COUNT 12/20/2020 1.5  1.0 - 4.5 thou/mcL Final   ? MONO ABSOLUTE 12/20/2020 0.4  0.1 - 0.8 thou/mcL Final   ? EOS ABSOLUTE 12/20/2020 0.2  0.0 - 0.5 thou/mcL Final   ? BASO ABSOLUTE 12/20/2020 0.0  0.0 - 0.2 thou/mcL Final   ? IG ABSOLUTE 12/20/2020 0.030  0.001 - 0.031 thou/mcL  Final   ? Na 12/20/2020 137  136 - 146 mmol/L Final   ? Potassium 12/20/2020 4.3  3.7 - 5.4 mmol/L Final   ? Cl 12/20/2020 103  97 - 108 mmol/L Final   ? CO2 12/20/2020 23  20 - 32 mmol/L Final   ? AGAP 12/20/2020 11  7 - 16 mmol/L Final   ? Glucose 12/20/2020 113 (A) 65 - 99 mg/dL Final   ? BUN 12/20/2020 14  6 - 24 mg/dL Final   ? Creatinine 12/20/2020 1.36 (A) 0.76 - 1.27 mg/dL Final   ? Ca 12/20/2020 10.0  8.7 - 10.2 mg/dL Final   ? ALK PHOS 12/20/2020 65  25 - 150 U/L Final   ? T Bili 12/20/2020 0.79  0.00 - 1.20 mg/dL Final   ? Total Protein 12/20/2020 7.0  6.0 - 8.5 gm/dL Final   ? Alb 12/20/2020 4.3  3.5 - 5.5 gm/dL Final   ? GLOBULIN 12/20/2020 2.7  1.5 - 4.5 gm/dL Final    ? ALBUMIN/GLOBULIN RATIO 12/20/2020 1.6  1.1 - 2.5 Final   ? BUN/CREAT RATIO 12/20/2020 10.3 (A) 11.0 - 26.0 Final   ? ALT 12/20/2020 20  0 - 55 U/L Final   ? AST 12/20/2020 18  0 - 40 U/L Final   ? eGFR 12/20/2020 60  mL/min/1.55m Final   ? Lipase 12/20/2020 36  0 - 59 U/L Final   ? Urine Color 12/20/2020 Yellow  Yellow  Final   ? Urine Appearance 12/20/2020 Clear  Clear Final   ? Urine Specific Gravity 12/20/2020 1.025  1.005 - 1.030 Final   ? Urine pH 12/20/2020 5.5  5 to 9 Final   ? Urine Protein - Dipstick 12/20/2020 Negative  Negative  mg/dl Final   ? Urine Glucose 12/20/2020 Negative  Negative mg/dL Final   ? Urine Ketones 12/20/2020 Negative  Negative mg/dl Final   ? Urine Bilirubin 12/20/2020 Negative  Negative mg/dL Final   ? Urine Blood 12/20/2020 Negative  Negative mg/dL Final   ? Urine Nitrite 12/20/2020 Negative  Negative Final   ?  Urine Urobilinogen 12/20/2020 1.0  <2.0 mg/dl Final   ? Urine Leukocyte Esterase 12/20/2020 Negative  Negative Leu/mcL Final         Radiology     No orders to display         Vital Signs this ED visit     Patient Vitals for the past 24 hrs:   BP Temp Temp src Pulse Resp SpO2 Height Weight   02/07/21 1025 (!) 152/85 98.1 F (36.7 C) Temporal 68 18 97 % 1.753 m (5' 9") 124.7 kg (275 lb)          Orland Dec, MD     ___________________________    This chart was generated using voice recognition software, therefore typographic, grammatical &/or transcription errors may have occurred.      Pt evaluated in room: [facility]      Orland Dec, MD  02/07/21 1339    Electronically signed by Orland Dec, MD at 02/07/2021  1:39 PM EDT

## 2021-02-07 NOTE — ED Provider Notes (Signed)
Formatting of this note is different from the original.  Images from the original note were not included.  ED Provider Notes by Orland Dec, MD at 02/07/2021 10:30 AM     Author: Orland Dec, MD Service: Emergency Medicine Author Type: Physician    Filed: 02/07/2021  1:39 PM Date of Service: 02/07/2021 10:30 AM Status: Signed    Editor: Orland Dec, MD (Physician)         [facility]  [place] [address]  Dept: [phone]  Dept Fax: [phone]       ED Impression     1. Acute left-sided back pain, unspecified back location      Disposition     ED Disposition     ED Disposition   Discharge    Condition   Stable    Comment   --         New prescriptions given to patient:  New Prescriptions    METHOCARBAMOL (ROBAXIN-750) 750 MG TABLET    Take one tablet (750 mg dose) by mouth 4 (four) times daily for 24 doses.     ___________________________________________________________  ___________________________________________________________   ___________________________________________________________     HPI   From ED Nurse Staff Triage Note(s)  Back Pain [12] Pt. states Left sided back pain, s/p lifting a 5 gallon bucket, pt. states pain from cervical to lumbar     L sided mid/low back pain -"I was helping my brother and carrying a 5 gallon bucket of crack filler and I pulled a muscle in my back."    Christopher Peters is a 60 y.o. male PTED w/ left-sided mid and low back pain which began 3 days ago.  Patient has been taking cyclobenzaprine and also took a 5 mg Percocet that he had leftover from one of his kidney stones.  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  Movement markedly worsens the pain.  He has had similar pain several times in the past.    Pt denies  Antiplatelet or anticoagulant medications, incontinence, lower extremity  weakness, radiculopathy, fever, chills    Patient's son will be driving.  He is followed by primary care physician down in [place].    Review of PMP reveals a total of 3 prescriptions.  All 3 are from emergency physicians, last prescription was 12 2 mg Dilaudid tablets.  I included [place] in the search.    PMD/Specialists   No care team member to display     Social Hx                              Social History     Tobacco Use   ? Smoking status: Current Every Day Smoker     Packs/day: 1.00     Types: Cigarettes   ? Smokeless tobacco: Never Used   Vaping Use   ? Vaping Use: Never used   Substance Use Topics   ? Alcohol use: Yes     Comment: Pt states last night he had 2 beers   ? Drug use: No     ROS   A complete review of systems was reviewed & negative or non-contributory to presentation except as noted in HPI  -Past Medical/Surgical/Social History reviewed by me (as documented by RN  notes)     Physical Exam     General/Constitutional1:  Available VS reviewed, WD, Body mass index is 40.61 kg/m.,   Masked  uncomfortable appearing, worse with movement  well-appearing,   Eyes2: focus & track, sclera non-icteric,   Head: NC/AT  ENMT3: Hearing is adequate to conversational voice,   Neck: No visible JVD or tracheal deviation is noted.  Cardiovascular4: RRR,   not tachycardic,   Respiratory5: Pt is breathing easily, speaking in complete sentences, no respiratory distress,   no increased work of breathing,   equal breath sounds bilaterally, no wheezes, no rales, no rhonchi,     Gastrointestinal6: non distended, soft,  non tender  Musculoskeletal7: Nontender over the midline TLS spine, somewhat tender to palpation in the paraspinous musculature of the mid and low back without trigger point, no saddle anesthesia, normal sensation, normal strength to include toe dorsi flexion.  Integumentary8: warm, dry, observed areas are without discoloration or rash,   Neurologic9: Observed portions of the face are symmetric &  fully expressive, No gross focal neuro deficits are appreciated  Psychologic10: The patient's mood, affect and interaction are appropriate to age, development and setting. awake & alert, cooperative, speech fluent,     Differential Diagnoses/MDM   Including but not limited to -musculoskeletal back pain without evidence of cord compromise    ___________________________________________________________  ___________________________________________________________   ___________________________________________________________     Home Medications:      Current Home Medications    Medication Sig   albuterol sulfate HFA (PROVENTIL,VENTOLIN,PROAIR) 108 (90 Base) MCG/ACT inhaler Inhale one puff to two puffs into the lungs every 6 (six) hours as needed for Wheezing.   atenolol (TENORMIN) 50 mg tablet Take 50 mg by mouth daily.   clonazePAM (KLONOPIN) 1 mg tablet Take 1 mg by mouth 2 (two) times a day as needed for Anxiety.   methocarbamol (ROBAXIN-750) 750 mg tablet Take one tablet (750 mg dose) by mouth 4 (four) times daily for 24 doses.   VITAMIN D, ERGOCALCIFEROL, PO Take by mouth.     Allergies:    Allergies   Allergen Reactions   ? Acetaminophen-Codeine Itching   ? Codeine Itching   ? Ketorolac Other     Pt told not to take d/t kidney issues   ? Nsaids Other     Kidney removal      Patient PMHx/PSHx      Past Medical History:   Diagnosis Date   ? Hypertension    ? Kidney stone    ? Kidney stones      Past Surgical History:   Procedure Laterality Date   ? Nephrectomy Left 07/2016       ED Course     1:38 PM  Patient feels better, requesting discharge.  Updated pt/family on workup results.  All questions answered,    PMD followup enouraged.  RTEDP discussed.      Medications  (ED/RX)     Meds administered in ER this visit:  Medications   ketorolac tromethamine (TORADOL) 15 mg/mL injection 15 mg (15 mg Intramuscular Refused 02/07/21 1140)   droperidol (INAPSINE) injection 1.25 mg (1.25 mg Intramuscular Given 02/07/21 1137)    methocarbamol (ROBAXIN) tablet 1,000 mg (1,000 mg Oral Given 02/07/21 1141)         Labs     No visits with results within 1 Day(s) from this visit.   Latest known visit with results is:   Admission on 12/20/2020, Discharged on 12/20/2020   Component Date Value Ref  Range Status   ? WBC 12/20/2020 6.2  5.1 - 10.8 thou/mcL Final   ? RBC 12/20/2020 4.65  4.05 - 5.64 million/mcL Final   ? HGB 12/20/2020 14.9  13.5 - 17.5 gm/dL Final   ? HCT 12/20/2020 43.7  40.5 - 52.5 % Final   ? MCV 12/20/2020 94  83 - 97 fL Final   ? MCH 12/20/2020 32.0  28.0 - 33.0 pg Final   ? MCHC 12/20/2020 34.1  32.0 - 36.0 gm/dL Final   ? Plt Ct 12/20/2020 151  150 - 400 thou/mcL Final   ? RDW SD 12/20/2020 45.8  36.0 - 47.0 fL Final   ? MPV 12/20/2020 10.7  8.9 - 11.0 fL Final   ? NRBC% 12/20/2020 0.0  0 /100WBC Final   ? NRBC 12/20/2020 0.000  0 thou/mcL Final   ? NEUTROPHIL % 12/20/2020 64.1  50.0 - 70.0 % Final   ? LYMPHOCYTE % 12/20/2020 24.7 (A) 25.0 - 40.0 % Final   ? MONOCYTE % 12/20/2020 7.1  4.0 - 12.0 % Final   ? Eosinophil % 12/20/2020 3.1  1.0 - 6.0 % Final   ? BASOPHIL % 12/20/2020 0.5  0.0 - 2.0 % Final   ? IG% 12/20/2020 0.500 (A) 0.001 - 0.429 % Final   ? ABSOLUTE NEUTROPHIL COUNT 12/20/2020 3.97  1.50 - 7.50 thou/mcL Final   ? ABSOLUTE LYMPHOCYTE COUNT 12/20/2020 1.5  1.0 - 4.5 thou/mcL Final   ? MONO ABSOLUTE 12/20/2020 0.4  0.1 - 0.8 thou/mcL Final   ? EOS ABSOLUTE 12/20/2020 0.2  0.0 - 0.5 thou/mcL Final   ? BASO ABSOLUTE 12/20/2020 0.0  0.0 - 0.2 thou/mcL Final   ? IG ABSOLUTE 12/20/2020 0.030  0.001 - 0.031 thou/mcL  Final   ? Na 12/20/2020 137  136 - 146 mmol/L Final   ? Potassium 12/20/2020 4.3  3.7 - 5.4 mmol/L Final   ? Cl 12/20/2020 103  97 - 108 mmol/L Final   ? CO2 12/20/2020 23  20 - 32 mmol/L Final   ? AGAP 12/20/2020 11  7 - 16 mmol/L Final   ? Glucose 12/20/2020 113 (A) 65 - 99 mg/dL Final   ? BUN 12/20/2020 14  6 - 24 mg/dL Final   ? Creatinine 12/20/2020 1.36 (A) 0.76 - 1.27 mg/dL Final   ? Ca 12/20/2020  10.0  8.7 - 10.2 mg/dL Final   ? ALK PHOS 12/20/2020 65  25 - 150 U/L Final   ? T Bili 12/20/2020 0.79  0.00 - 1.20 mg/dL Final   ? Total Protein 12/20/2020 7.0  6.0 - 8.5 gm/dL Final   ? Alb 12/20/2020 4.3  3.5 - 5.5 gm/dL Final   ? GLOBULIN 12/20/2020 2.7  1.5 - 4.5 gm/dL Final   ? ALBUMIN/GLOBULIN RATIO 12/20/2020 1.6  1.1 - 2.5 Final   ? BUN/CREAT RATIO 12/20/2020 10.3 (A) 11.0 - 26.0 Final   ? ALT 12/20/2020 20  0 - 55 U/L Final   ? AST 12/20/2020 18  0 - 40 U/L Final   ? eGFR 12/20/2020 60  mL/min/1.26m Final   ? Lipase 12/20/2020 36  0 - 59 U/L Final   ? Urine Color 12/20/2020 Yellow  Yellow  Final   ? Urine Appearance 12/20/2020 Clear  Clear Final   ? Urine Specific Gravity 12/20/2020 1.025  1.005 - 1.030 Final   ? Urine pH 12/20/2020 5.5  5 to 9 Final   ? Urine Protein - Dipstick 12/20/2020 Negative  Negative  mg/dl Final   ? Urine Glucose 12/20/2020 Negative  Negative mg/dL Final   ? Urine Ketones 12/20/2020 Negative  Negative mg/dl Final   ? Urine Bilirubin 12/20/2020 Negative  Negative mg/dL Final   ? Urine Blood 12/20/2020 Negative  Negative mg/dL Final   ? Urine Nitrite 12/20/2020 Negative  Negative Final   ? Urine Urobilinogen 12/20/2020 1.0  <2.0 mg/dl Final   ? Urine Leukocyte Esterase 12/20/2020 Negative  Negative Leu/mcL Final         Radiology     No orders to display         Vital Signs this ED visit     Patient Vitals for the past 24 hrs:   BP Temp Temp src Pulse Resp SpO2 Height Weight   02/07/21 1025 (!) 152/85 98.1 F (36.7 C) Temporal 68 18 97 % 1.753 m (5' 9" ) 124.7 kg (275 lb)          Orland Dec, MD     ___________________________    This chart was generated using voice recognition software, therefore typographic, grammatical &/or transcription errors may have occurred.      Pt evaluated in room: [place]      Orland Dec, MD  02/07/21 1339      Electronically signed by Laurita Quint at 02/07/2021  1:39 PM EDT

## 2021-02-07 NOTE — ED Notes (Signed)
Formatting of this note is different from the original.  COLLECTIVE?NOTIFICATION?02/07/2021 10:18?Christopher Peters?MRN: [id number]    [facility]'s patient encounter information:   [id number]  Account [id number]  Billing Account [id number]    Criteria Met      5 + ED Visits in 12 Months    3 + Facilities in 90 Days    Security and Safety  No Security Events were found.  ED Care Guidelines  There are currently no ED Care Guidelines for this patient. Please check your facility's medical records system.    E.D. Visit Count (12 mo.)  Facility Visits   [place] [facility] 1   Total 7   Note: Visits indicate total known visits.     Recent Emergency Department Visit Summary  Date Facility [facility] Type Diagnoses or Chief Complaint    Feb 07, 2021  [facility]  [name]  [place]  Emergency      back pain     Feb 01, 2021  [place]      SOB      Kidney Stone Worsening      Flank Pain      Unspecified abdominal pain     Jan 24, 2021  [place]      Back Pain; Dark Urine      Flank Pain      Calculus of kidney      Unspecified abdominal pain     Jan 03, 2021  [place]  [place]  Chief Complaint: PT STS KIDNEY STONE    Dec 20, 2020  [facility].  [name]      BACK PAIN      Flank Pain      Unspecified abdominal pain      Other intervertebral disc degeneration, lumbar region      Disorder of kidney and ureter, unspecified     Mar 29, 2020  [place].  [place]  Emergency      Essential (primary) hypertension      Diverticulosis of large intestine without perforation or abscess without bleeding      Fatty (change of) liver, not  elsewhere classified      Other intervertebral disc degeneration, lumbar region      Ventral hernia without obstruction or gangrene      Nicotine dependence, cigarettes, uncomplicated      Acquired absence of kidney      Allergy status to analgesic agent      Spondylosis without myelopathy or radiculopathy, lumbar region      Unspecified abdominal pain     Mar 01, 2020  [place]  [place]      KIDNEY STONE      Flank Pain       Recent Inpatient Visit Summary  No Recent Inpatient Visits were found.  Care Team  No Care Team was found.  Collective Portal  This patient has registered at the [facility] Emergency Department   For more information  visit: [email or web address]     PLEASE NOTE:     1.   Any care recommendations and other clinical information are provided as guidelines or for historical purposes only, and providers should exercise their own clinical judgment when providing care.    2.   You may only use this information for purposes of treatment, payment or health care operations activities, and subject to the limitations of applicable [employer] Policies.    3.   You should consult directly with the organization that provided a care guideline or other clinical history with any questions about additional information or accuracy or completeness of information provided.    ? 2022 [employer] - [email or web address].[email or web address]     Electronically signed by Engineer, structural, Transcription Incoming at 02/07/2021 10:20 AM EDT

## 2021-02-25 ENCOUNTER — Emergency Department: Admit: 2021-02-25 | Payer: PRIVATE HEALTH INSURANCE | Primary: Family Medicine

## 2021-02-25 ENCOUNTER — Inpatient Hospital Stay
Admit: 2021-02-25 | Discharge: 2021-02-26 | Disposition: A | Discharge status: Home / Self Care | Payer: PRIVATE HEALTH INSURANCE | Attending: Emergency Medicine

## 2021-02-25 LAB — COMPREHENSIVE METABOLIC PANEL
ALT: 35 U/L (ref 12–78)
AST: 18 U/L (ref 15–37)
Albumin/Globulin Ratio: 1.3 (ref 1.1–2.2)
Albumin: 4.2 g/dL (ref 3.5–5.0)
Alkaline Phosphatase: 69 U/L (ref 45–117)
Anion Gap: 7 mmol/L (ref 5–15)
BUN: 15 mg/dL (ref 6–20)
Bun/Cre Ratio: 12 (ref 12–20)
CO2: 24 mmol/L (ref 21–32)
Calcium: 9.4 mg/dL (ref 8.5–10.1)
Chloride: 106 mmol/L (ref 97–108)
Creatinine: 1.22 mg/dL (ref 0.70–1.30)
ESTIMATED GLOMERULAR FILTRATION RATE: >60 mL/min/{1.73_m2} (ref >60)
Globulin: 3.3 g/dL (ref 2.0–4.0)
Glucose: 88 mg/dL (ref 65–100)
Potassium: 4 mmol/L (ref 3.5–5.1)
Sodium: 137 mmol/L (ref 136–145)
Total Bilirubin: 0.7 mg/dL (ref 0.2–1.0)
Total Protein: 7.5 g/dL (ref 6.4–8.2)

## 2021-02-25 LAB — CBC WITH AUTO DIFFERENTIAL
Basophils %: 1 % (ref 0–1)
Basophils Absolute: 0.1 10*3/uL (ref 0.0–0.1)
Eosinophils %: 4 % (ref 0–7)
Eosinophils Absolute: 0.3 10*3/uL (ref 0.0–0.4)
Granulocyte Absolute Count: 0 10*3/uL (ref 0.00–0.04)
Hematocrit: 46.1 % (ref 36.6–50.3)
Hemoglobin: 15.8 g/dL (ref 12.1–17.0)
Immature Granulocytes: 0 % (ref 0–0.5)
Lymphocytes %: 30 % (ref 12–49)
Lymphocytes Absolute: 2.6 10*3/uL (ref 0.8–3.5)
MCH: 31.3 PG (ref 26.0–34.0)
MCHC: 34.3 g/dL (ref 30.0–36.5)
MCV: 91.5 FL (ref 80.0–99.0)
MPV: 10.9 FL (ref 8.9–12.9)
Monocytes %: 8 % (ref 5–13)
Monocytes Absolute: 0.7 10*3/uL (ref 0.0–1.0)
NRBC Absolute: 0 10*3/uL (ref 0.00–0.01)
Neutrophils %: 57 % (ref 32–75)
Neutrophils Absolute: 5 10*3/uL (ref 1.8–8.0)
Nucleated RBCs: 0 PER 100 WBC
Platelets: 180 10*3/uL (ref 150–400)
RBC: 5.04 M/uL (ref 4.10–5.70)
RDW: 12.8 % (ref 11.5–14.5)
WBC: 8.7 10*3/uL (ref 4.1–11.1)

## 2021-02-25 LAB — CBC WITH AUTOMATED DIFF
ABS. BASOPHILS: 0.1 10*3/uL (ref 0.0–0.1)
ABS. EOSINOPHILS: 0.3 10*3/uL (ref 0.0–0.4)
ABS. IMM. GRANS.: 0 10*3/uL (ref 0.00–0.04)
ABS. LYMPHOCYTES: 2.6 10*3/uL (ref 0.8–3.5)
ABS. MONOCYTES: 0.7 10*3/uL (ref 0.0–1.0)
ABS. NEUTROPHILS: 5 10*3/uL (ref 1.8–8.0)
ABSOLUTE NRBC: 0 10*3/uL (ref 0.00–0.01)
BASOPHILS: 1 % (ref 0–1)
EOSINOPHILS: 4 % (ref 0–7)
HCT: 46.1 % (ref 36.6–50.3)
HGB: 15.8 g/dL (ref 12.1–17.0)
IMMATURE GRANULOCYTES: 0 % (ref 0–0.5)
LYMPHOCYTES: 30 % (ref 12–49)
MCH: 31.3 PG (ref 26.0–34.0)
MCHC: 34.3 g/dL (ref 30.0–36.5)
MCV: 91.5 FL (ref 80.0–99.0)
MONOCYTES: 8 % (ref 5–13)
MPV: 10.9 FL (ref 8.9–12.9)
NEUTROPHILS: 57 % (ref 32–75)
NRBC: 0 PER 100 WBC
PLATELET: 180 10*3/uL (ref 150–400)
RBC: 5.04 M/uL (ref 4.10–5.70)
RDW: 12.8 % (ref 11.5–14.5)
WBC: 8.7 10*3/uL (ref 4.1–11.1)

## 2021-02-25 LAB — METABOLIC PANEL, COMPREHENSIVE
A-G Ratio: 1.3 (ref 1.1–2.2)
ALT (SGPT): 35 U/L (ref 12–78)
AST (SGOT): 18 U/L (ref 15–37)
Albumin: 4.2 g/dL (ref 3.5–5.0)
Alk. phosphatase: 69 U/L (ref 45–117)
Anion gap: 7 mmol/L (ref 5–15)
BUN/Creatinine ratio: 12 (ref 12–20)
BUN: 15 mg/dL (ref 6–20)
Bilirubin, total: 0.7 mg/dL (ref 0.2–1.0)
CO2: 24 mmol/L (ref 21–32)
Calcium: 9.4 mg/dL (ref 8.5–10.1)
Chloride: 106 mmol/L (ref 97–108)
Creatinine: 1.22 mg/dL (ref 0.70–1.30)
Globulin: 3.3 g/dL (ref 2.0–4.0)
Glucose: 88 mg/dL (ref 65–100)
Potassium: 4 mmol/L (ref 3.5–5.1)
Protein, total: 7.5 g/dL (ref 6.4–8.2)
Sodium: 137 mmol/L (ref 136–145)
eGFR: >60 mL/min/{1.73_m2} (ref >60)

## 2021-02-25 NOTE — ED Triage Notes (Signed)
Formatting of this note might be different from the original.  Left lower back pain x 2-3 weeks. Pt also reports right flank pain, hx kidney stones. Pt also reports hx left nephrectomy. Decreased urination, dark urine, onset 02/24/2021.  Electronically signed by Leighton Ruff at 02/25/2021  6:28 PM EDT

## 2021-02-25 NOTE — ED Provider Notes (Signed)
ED Provider Notes by Pearson Forster, DO at 02/25/21 2040                Author: Pearson Forster, DO  Service: --  Author Type: Physician       Filed: 02/25/21 2142  Date of Service: 02/25/21 2040  Status: Signed          Editor: Pearson Forster, DO (Physician)               EMERGENCY DEPARTMENT HISTORY AND PHYSICAL EXAM           Date: 02/25/2021   Patient Name: Christopher Peters        History of Presenting Illness          Chief Complaint       Patient presents with        ?  Back Pain           History Provided By: Patient      HPI: Christopher Peters, 60 y.o. male with past medical history significant for hypertension, kidney stones presenting to the emergency department  for evaluation of bilateral flank pain.  Patient reports muscle spasm to the left low back, however sharp, right-sided back pain is new as of this morning.  Patient reports symptoms feel similar to prior kidney stones.  Denies nausea/vomiting.  States  his urine has been darker and he does have some urinary hesitancy.      There are no other complaints, changes, or physical findings at this time.      PCP: Other, Phys, MD        No current facility-administered medications on file prior to encounter.          Current Outpatient Medications on File Prior to Encounter          Medication  Sig  Dispense  Refill           ?  [DISCONTINUED] ondansetron (Zofran ODT) 4 mg disintegrating tablet  Take 1 Tab by mouth every eight (8) hours as needed for Nausea.  10 Tab  0             Past History        Past Medical History:     Past Medical History:        Diagnosis  Date         ?  Hypertension           ?  Kidney stones             Past Surgical History:     Past Surgical History:         Procedure  Laterality  Date          ?  HX NEPHRECTOMY  Left             Family History:   History reviewed. No pertinent family history.      Social History:     Social History          Tobacco Use         ?  Smoking status:  Every Day              Types:   Cigarettes         ?  Smokeless tobacco:  Never       Substance Use Topics         ?  Alcohol use:  Not Currently         ?  Drug use:  Never           Allergies:     Allergies        Allergen  Reactions         ?  Acetaminophen-Codeine  Other (comments)             "made my skin crawl"         ?  Toradol [Ketorolac]  Other (comments)             States he only has one kidney and med is bad for kidney             Review of Systems     Review of Systems    Constitutional:  Negative for chills and fever.    HENT:  Negative for congestion and rhinorrhea.     Eyes:  Negative for photophobia and visual disturbance.    Respiratory:  Negative for cough and shortness of breath.     Cardiovascular:  Negative for chest pain and palpitations.    Gastrointestinal:  Negative for abdominal pain, diarrhea, nausea and vomiting.    Genitourinary:  Positive for decreased urine volume, difficulty urinating  and flank pain. Negative for dysuria.    Musculoskeletal:  Negative for arthralgias and myalgias.    Skin:  Negative for color change and rash.    Neurological:  Negative for weakness and headaches.         Physical Exam     Physical Exam   Constitutional :        General: He is not in acute distress.      Appearance: Normal appearance. He is not ill-appearing.       Comments: Uncomfortable appearing    HENT:       Head: Normocephalic and atraumatic.       Right Ear: External ear normal.       Left Ear: External ear normal.       Nose: Nose normal.       Mouth/Throat:       Mouth: Mucous membranes are moist.    Eyes:       Extraocular Movements: Extraocular movements intact.       Conjunctiva/sclera: Conjunctivae normal.       Pupils: Pupils are equal, round, and reactive to light.     Cardiovascular:       Rate and Rhythm: Normal rate and regular rhythm.       Pulses: Normal pulses.    Pulmonary:       Effort: Pulmonary effort is normal. No respiratory distress.       Breath sounds: Normal breath sounds.     Abdominal:        General: Abdomen is flat. There is no distension.       Tenderness: There is no right CVA tenderness or left CVA tenderness.     Musculoskeletal:          General: Normal range of motion.       Cervical back: Normal range of motion.    Skin:      General: Skin is warm and dry.    Neurological:       General: No focal deficit present.       Mental Status: He is alert and oriented to person, place, and time.    Psychiatric:          Mood and Affect: Mood normal.          Behavior: Behavior normal.  Thought Content: Thought content normal.          Judgment: Judgment normal.            Lab and Diagnostic Study Results     Labs -         Recent Results (from the past 12 hour(s))     CBC WITH AUTOMATED DIFF          Collection Time: 02/25/21  6:42 PM         Result  Value  Ref Range            WBC  8.7  4.1 - 11.1 K/uL       RBC  5.04  4.10 - 5.70 M/uL       HGB  15.8  12.1 - 17.0 g/dL       HCT  46.1  36.6 - 50.3 %       MCV  91.5  80.0 - 99.0 FL       MCH  31.3  26.0 - 34.0 PG       MCHC  34.3  30.0 - 36.5 g/dL       RDW  12.8  11.5 - 14.5 %       PLATELET  180  150 - 400 K/uL       MPV  10.9  8.9 - 12.9 FL       NRBC  0.0  0.0 PER 100 WBC       ABSOLUTE NRBC  0.00  0.00 - 0.01 K/uL       NEUTROPHILS  57  32 - 75 %       LYMPHOCYTES  30  12 - 49 %       MONOCYTES  8  5 - 13 %       EOSINOPHILS  4  0 - 7 %       BASOPHILS  1  0 - 1 %       IMMATURE GRANULOCYTES  0  0 - 0.5 %       ABS. NEUTROPHILS  5.0  1.8 - 8.0 K/UL       ABS. LYMPHOCYTES  2.6  0.8 - 3.5 K/UL       ABS. MONOCYTES  0.7  0.0 - 1.0 K/UL       ABS. EOSINOPHILS  0.3  0.0 - 0.4 K/UL       ABS. BASOPHILS  0.1  0.0 - 0.1 K/UL       ABS. IMM. GRANS.  0.0  0.00 - 0.04 K/UL       DF  AUTOMATED          METABOLIC PANEL, COMPREHENSIVE          Collection Time: 02/25/21  6:42 PM         Result  Value  Ref Range            Sodium  137  136 - 145 mmol/L       Potassium  4.0  3.5 - 5.1 mmol/L       Chloride  106  97 - 108 mmol/L       CO2  24  21 - 32 mmol/L        Anion gap  7  5 - 15 mmol/L       Glucose  88  65 - 100 mg/dL       BUN  15  6 - 20 mg/dL  Creatinine  1.22  0.70 - 1.30 mg/dL       BUN/Creatinine ratio  12  12 - 20         eGFR  >60  >60 ml/min/1.96m       Calcium  9.4  8.5 - 10.1 mg/dL       Bilirubin, total  0.7  0.2 - 1.0 mg/dL       AST (SGOT)  18  15 - 37 U/L       ALT (SGPT)  35  12 - 78 U/L       Alk. phosphatase  69  45 - 117 U/L       Protein, total  7.5  6.4 - 8.2 g/dL       Albumin  4.2  3.5 - 5.0 g/dL       Globulin  3.3  2.0 - 4.0 g/dL       A-G Ratio  1.3  1.1 - 2.2         URINALYSIS W/ REFLEX CULTURE          Collection Time: 02/25/21  8:06 PM       Specimen: Urine         Result  Value  Ref Range            Color  Yellow/Straw          Appearance  Clear  Clear         Specific gravity  1.020  1.003 - 1.030         pH (UA)  5.0  5.0 - 8.0         Protein  Negative  Negative mg/dL       Glucose  Negative  Negative mg/dL       Ketone  Negative  Negative mg/dL       Bilirubin  Negative  Negative         Blood  Negative  Negative         Urobilinogen  2.0 (H)  0.1 - 1.0 EU/dL       Nitrites  Negative  Negative         Leukocyte Esterase  Negative  Negative         WBC  0-4  0 - 4 /hpf       RBC  0-5  0 - 5 /hpf       Bacteria  1+ (A)  Negative /hpf       UA:UC IF INDICATED  Culture not indicated by UA result  Culture not indicated by UA result              Mucus  Trace (A)  Negative /lpf           Radiologic Studies -    @lastxrresult @     CT Results  (Last 48 hours)                                       02/25/21 1932    CT ABD PELV WO CONT  Final result            Impression:           1. Small nonobstructive stone in the right kidney. No hydronephrosis. No      evidence of acute process.      2. Status post left nephrectomy.  Narrative:    EXAM: CT ABD PELV WO CONT             INDICATION: R flank pain             COMPARISON: 03/31/2019             IV CONTRAST: None.             ORAL CONTRAST: None              TECHNIQUE:       Thin axial images were obtained through the abdomen and pelvis. Coronal and      sagittal reformats were generated. CT dose reduction was achieved through use of      a standardized protocol tailored for this examination and automatic exposure      control for dose modulation.              The absence of intravenous contrast material reduces the sensitivity for      evaluation of the vasculature and solid organs.             FINDINGS:       LOWER THORAX: No significant abnormality in the incidentally imaged lower chest.      LIVER: No mass.      BILIARY TREE: Gallbladder is within normal limits. CBD is not dilated.      SPLEEN: within normal limits.      PANCREAS: No focal abnormality.      ADRENALS: Unremarkable.      KIDNEYS/URETERS: There is a punctate nodular to stone in the lower pole the      right kidney. No hydronephrosis. There is a 3.7 cm cyst in the medial right      kidney. The patient is status post left nephrectomy. Small areas of fat necrosis      are seen in the surgical bed.      STOMACH: Unremarkable.      SMALL BOWEL: No dilatation or wall thickening.      COLON: No dilatation or wall thickening. Several colonic diverticula are      present. No evidence of diverticulitis.      APPENDIX: Unremarkable      PERITONEUM: No ascites or pneumoperitoneum.      RETROPERITONEUM: No lymphadenopathy or aortic aneurysm.      REPRODUCTIVE ORGANS: Within normal limits.      URINARY BLADDER: No mass or calculus.      BONES: No destructive bone lesion.      ABDOMINAL WALL: There is a complex fat-containing umbilical hernia.      ADDITIONAL COMMENTS: N/A                                      CXR Results  (Last 48 hours)             None                       Medical Decision Making and ED Course     Differential Diagnosis & Medical Decision Making Provider Note:    60 year old male with past medical history significant for hypertension, kidney stones presenting for evaluation of right-sided low back  pain starting this morning.  Patient reports symptoms feel similar  to prior kidney stones.  On exam, patient is uncomfortable appearing.  He has no CVA tenderness.  Abdomen is soft, nontender, nondistended  without guarding, rebound, rigidity.  CBC, CMP, urinalysis without significant abnormality.  CT demonstrating small  nonobstructing right-sided kidney stone.  Will discharge home with antiemetics and Flomax as well as urologic follow-up information.      - I am the first provider for this patient.  I reviewed the vital signs, available nursing notes, past medical history, past surgical history, family history and social history. The patients presenting problems have been discussed, and they are in agreement  with the care plan formulated and outlined with them.  I have encouraged them to ask questions as they arise throughout their visit.      Vital Signs-Reviewed the patient's vital signs.   Patient Vitals for the past 12 hrs:            Temp  Pulse  Resp  BP  SpO2            02/25/21 2019  97.6 ??F (36.4 ??C)  61  16  (!) 147/79  98 %            02/25/21 1828  98.1 ??F (36.7 ??C)  75  16  (!) 150/89  98 %           ED Course:                Procedures     Performed by: Pearson Forster, DO   Procedures          Disposition     Disposition: Condition stable   DC- Adult Discharges: All of the diagnostic tests were reviewed and questions answered. Diagnosis, care plan and treatment options were discussed.  The patient understands the instructions and will follow up as directed. The patients results have been  reviewed with them.  They have been counseled regarding their diagnosis.  The patient verbally convey understanding and agreement of the signs, symptoms, diagnosis, treatment and prognosis and additionally agrees to follow up as recommended with their  PCP in 24 - 48 hours.  They also agree with the care-plan and convey that all of their questions have been answered.  I have also put together some discharge  instructions for them that include: 1) educational information regarding their diagnosis, 2)  how to care for their diagnosis at home, as well a 3) list of reasons why they would want to return to the ED prior to their follow-up appointment, should their condition change.   DC-The patient was given verbal follow-up instructions      DISCHARGE PLAN:   1. Cannot display discharge medications since this patient is not currently admitted.      2.      Follow-up Information                  Follow up With  Specialties  Details  Why  [facility]  Emergency Medicine  Call   As needed, If symptoms worsen  [place]              Lonell Face, MD  Urology  Schedule an appointment as soon as possible for a visit     [place] [id number]   [phone]                     3.  Return to ED if worse    4.      Discharge Medication List as of 02/25/2021  8:33 PM                 START taking these medications          Details        tamsulosin (Flomax) 0.4 mg capsule  Take 1 Capsule by mouth daily for 10 days., Normal, Disp-10 Capsule, R-0               ondansetron hcl (Zofran) 4 mg tablet  Take 1 Tablet by mouth every eight (8) hours as needed for Nausea or Vomiting., Normal, Disp-20 Tablet, R-0                       Remove if admitted/transferred        Diagnosis/Clinical Impression        Clinical Impression:       1.  Kidney stone            Attestations: I,  Pearson Forster, DO, am the primary clinician of record.      Please note that this dictation was completed with Dragon, the computer voice recognition software.  Quite often unanticipated grammatical, syntax, homophones, and other interpretive errors are inadvertently  transcribed by the computer software.  Please disregard these errors.  Please excuse any errors that have escaped final proofreading.  Thank you.

## 2021-02-25 NOTE — ED Notes (Signed)
ED Triage Notes by Leighton Ruff at 02/25/21 1817                Author: Leighton Ruff  Service: EMERGENCY  Author Type: Registered Nurse       Filed: 02/25/21 1828  Date of Service: 02/25/21 1817  Status: Signed          Editor: Leighton Ruff (Registered Nurse)               Left lower back pain x 2-3 weeks. Pt also reports right flank pain, hx kidney stones. Pt also reports hx left nephrectomy. Decreased urination, dark urine, onset 02/24/2021.

## 2021-02-25 NOTE — ED Notes (Signed)
Formatting of this note might be different from the original.  Pt discharged with instructions.  Questions encouraged.  Pharmacy verified and equipment removed.    Pt encouraged to increase fluid intake and return if pain worsened.  Electronically signed by Ileana Roup, RN at 02/25/2021  8:40 PM EDT

## 2021-02-25 NOTE — ED Notes (Signed)
ED Notes by Ileana Roup, RN at 02/25/21 2039                Author: Ileana Roup, RN  Service: EMERGENCY  Author Type: Registered Nurse       Filed: 02/25/21 2040  Date of Service: 02/25/21 2039  Status: Signed          Editor: Ileana Roup, RN (Registered Nurse)               Pt discharged with instructions.  Questions encouraged.  Pharmacy verified and equipment removed.     Pt encouraged to increase fluid intake and return if pain worsened.

## 2021-02-25 NOTE — ED Provider Notes (Signed)
Formatting of this note is different from the original.  Images from the original note were not included.  EMERGENCY DEPARTMENT HISTORY AND PHYSICAL EXAM    Date: 02/25/2021  Patient Name: Christopher Peters    History of Presenting Illness     Chief Complaint   Patient presents with    Back Pain     History Provided By: Patient    HPI: Christopher Peters, 60 y.o. male with past medical history significant for hypertension, kidney stones presenting to the emergency department for evaluation of bilateral flank pain.  Patient reports muscle spasm to the left low back, however sharp, right-sided back pain is new as of this morning.  Patient reports symptoms feel similar to prior kidney stones.  Denies nausea/vomiting.  States his urine has been darker and he does have some urinary hesitancy.    There are no other complaints, changes, or physical findings at this time.    PCP: Other, Phys, MD    No current facility-administered medications on file prior to encounter.     Current Outpatient Medications on File Prior to Encounter   Medication Sig Dispense Refill    [DISCONTINUED] ondansetron (Zofran ODT) 4 mg disintegrating tablet Take 1 Tab by mouth every eight (8) hours as needed for Nausea. 10 Tab 0     Past History     Past Medical History:  Past Medical History:   Diagnosis Date    Hypertension     Kidney stones      Past Surgical History:  Past Surgical History:   Procedure Laterality Date    HX NEPHRECTOMY Left      Family History:  History reviewed. No pertinent family history.    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   HENT:  Negative for congestion and rhinorrhea.    Eyes:  Negative for photophobia and visual disturbance.   Respiratory:  Negative for cough and shortness of breath.    Cardiovascular:  Negative for chest pain and palpitations.   Gastrointestinal:  Negative for abdominal pain, diarrhea, nausea and vomiting.   Genitourinary:  Positive for decreased urine volume, difficulty urinating and flank pain. Negative for dysuria.   Musculoskeletal:  Negative for arthralgias and myalgias.   Skin:  Negative for color change and rash.   Neurological:  Negative for weakness and headaches.     Physical Exam   Physical Exam  Constitutional:       General: He is not in acute distress.     Appearance: Normal appearance. He is not ill-appearing.      Comments: Uncomfortable appearing   HENT:      Head: Normocephalic and atraumatic.      Right Ear: External ear normal.      Left Ear: External ear normal.      Nose: Nose normal.      Mouth/Throat:  Mouth: Mucous membranes are moist.   Eyes:      Extraocular Movements: Extraocular movements intact.      Conjunctiva/sclera: Conjunctivae normal.      Pupils: Pupils are equal, round, and reactive to light.   Cardiovascular:      Rate and Rhythm: Normal rate and regular rhythm.      Pulses: Normal pulses.   Pulmonary:      Effort: Pulmonary effort is normal. No respiratory distress.      Breath sounds: Normal breath sounds.   Abdominal:      General: Abdomen is flat. There is no distension.      Tenderness: There is no right CVA tenderness or left CVA tenderness.   Musculoskeletal:         General: Normal range of motion.      Cervical back: Normal range of motion.   Skin:     General: Skin is warm and dry.   Neurological:      General: No focal deficit present.      Mental Status: He is alert and oriented to person, place, and time.   Psychiatric:         Mood and Affect: Mood normal.         Behavior: Behavior normal.          Thought Content: Thought content normal.         Judgment: Judgment normal.     Lab and Diagnostic Study Results   Labs -    Recent Results (from the past 12 hour(s))   CBC WITH AUTOMATED DIFF    Collection Time: 02/25/21  6:42 PM   Result Value Ref Range    WBC 8.7 4.1 - 11.1 K/uL    RBC 5.04 4.10 - 5.70 M/uL    HGB 15.8 12.1 - 17.0 g/dL    HCT 46.1 36.6 - 50.3 %    MCV 91.5 80.0 - 99.0 FL    MCH 31.3 26.0 - 34.0 PG    MCHC 34.3 30.0 - 36.5 g/dL    RDW 12.8 11.5 - 14.5 %    PLATELET 180 150 - 400 K/uL    MPV 10.9 8.9 - 12.9 FL    NRBC 0.0 0.0 PER 100 WBC    ABSOLUTE NRBC 0.00 0.00 - 0.01 K/uL    NEUTROPHILS 57 32 - 75 %    LYMPHOCYTES 30 12 - 49 %    MONOCYTES 8 5 - 13 %    EOSINOPHILS 4 0 - 7 %    BASOPHILS 1 0 - 1 %    IMMATURE GRANULOCYTES 0 0 - 0.5 %    ABS. NEUTROPHILS 5.0 1.8 - 8.0 K/UL    ABS. LYMPHOCYTES 2.6 0.8 - 3.5 K/UL    ABS. MONOCYTES 0.7 0.0 - 1.0 K/UL    ABS. EOSINOPHILS 0.3 0.0 - 0.4 K/UL    ABS. BASOPHILS 0.1 0.0 - 0.1 K/UL    ABS. IMM. GRANS. 0.0 0.00 - 0.04 K/UL    DF AUTOMATED     METABOLIC PANEL, COMPREHENSIVE    Collection Time: 02/25/21  6:42 PM   Result Value Ref Range    Sodium 137 136 - 145 mmol/L    Potassium 4.0 3.5 - 5.1 mmol/L    Chloride 106 97 - 108 mmol/L    CO2 24 21 - 32 mmol/L    Anion gap 7 5 - 15 mmol/L    Glucose 88 65 - 100 mg/dL    BUN 15  6 - 20 mg/dL    Creatinine 1.22 0.70 - 1.30 mg/dL    BUN/Creatinine ratio 12 12 - 20      eGFR >60 >60 ml/min/1.67m    Calcium 9.4 8.5 - 10.1 mg/dL    Bilirubin, total 0.7 0.2 - 1.0 mg/dL    AST (SGOT) 18 15 - 37 U/L    ALT (SGPT) 35 12 - 78 U/L    Alk. phosphatase 69 45 - 117 U/L    Protein, total 7.5 6.4 - 8.2 g/dL    Albumin 4.2 3.5 - 5.0 g/dL    Globulin 3.3 2.0 - 4.0 g/dL    A-G Ratio 1.3 1.1 - 2.2     URINALYSIS W/ REFLEX CULTURE    Collection Time: 02/25/21  8:06 PM    Specimen: Urine   Result Value Ref Range    Color Yellow/Straw      Appearance Clear Clear      Specific gravity 1.020 1.003 - 1.030      pH (UA) 5.0 5.0 - 8.0       Protein Negative Negative mg/dL    Glucose Negative Negative mg/dL    Ketone Negative Negative mg/dL    Bilirubin Negative Negative      Blood Negative Negative      Urobilinogen 2.0 (H) 0.1 - 1.0 EU/dL    Nitrites Negative Negative      Leukocyte Esterase Negative Negative      WBC 0-4 0 - 4 /hpf    RBC 0-5 0 - 5 /hpf    Bacteria 1+ (A) Negative /hpf    UA:UC IF INDICATED Culture not indicated by UA result Culture not indicated by UA result      Mucus Trace (A) Negative /lpf     Radiologic Studies -   @lastxrresult @  CT Results  (Last 48 hours)        02/25/21 1932  CT ABD PELV WO CONT Final result    Impression:      1. Small nonobstructive stone in the right kidney. No hydronephrosis. No   evidence of acute process.   2. Status post left nephrectomy.      Narrative:  EXAM: CT ABD PELV WO CONT     INDICATION: R flank pain     COMPARISON: 03/31/2019     IV CONTRAST: None.     ORAL CONTRAST: None     TECHNIQUE:    Thin axial images were obtained through the abdomen and pelvis. Coronal and   sagittal reformats were generated. CT dose reduction was achieved through use of   a standardized protocol tailored for this examination and automatic exposure   control for dose modulation.      The absence of intravenous contrast material reduces the sensitivity for   evaluation of the vasculature and solid organs.     FINDINGS:    LOWER THORAX: No significant abnormality in the incidentally imaged lower chest.   LIVER: No mass.   BILIARY TREE: Gallbladder is within normal limits. CBD is not dilated.   SPLEEN: within normal limits.   PANCREAS: No focal abnormality.   ADRENALS: Unremarkable.   KIDNEYS/URETERS: There is a punctate nodular to stone in the lower pole the   right kidney. No hydronephrosis. There is a 3.7 cm cyst in the medial right   kidney. The patient is status post left nephrectomy. Small areas of fat necrosis   are seen in the surgical bed.   STOMACH: Unremarkable.   SMALL BOWEL:  No dilatation or wall thickening.    COLON: No dilatation or wall thickening. Several colonic diverticula are   present. No evidence of diverticulitis.   APPENDIX: Unremarkable   PERITONEUM: No ascites or pneumoperitoneum.   RETROPERITONEUM: No lymphadenopathy or aortic aneurysm.   REPRODUCTIVE ORGANS: Within normal limits.   URINARY BLADDER: No mass or calculus.   BONES: No destructive bone lesion.   ABDOMINAL WALL: There is a complex fat-containing umbilical hernia.   ADDITIONAL COMMENTS: N/A             CXR Results  (Last 48 hours)      None         Medical Decision Making and ED Course   Differential Diagnosis & Medical Decision Making Provider Note:   60 year old male with past medical history significant for hypertension, kidney stones presenting for evaluation of right-sided low back pain starting this morning.  Patient reports symptoms feel similar to prior kidney stones.  On exam, patient is uncomfortable appearing.  He has no CVA tenderness.  Abdomen is soft, nontender, nondistended without guarding, rebound, rigidity.  CBC, CMP, urinalysis without significant abnormality.  CT demonstrating small nonobstructing right-sided kidney stone.  Will discharge home with antiemetics and Flomax as well as urologic follow-up information.    - I am the first provider for this patient.  I reviewed the vital signs, available nursing notes, past medical history, past surgical history, family history and social history. The patients presenting problems have been discussed, and they are in agreement with the care plan formulated and outlined with them.  I have encouraged them to ask questions as they arise throughout their visit.    Vital Signs-Reviewed the patient's vital signs.  Patient Vitals for the past 12 hrs:   Temp Pulse Resp BP SpO2   02/25/21 2019 97.6 F (36.4 C) 61 16 (!) 147/79 98 %   02/25/21 1828 98.1 F (36.7 C) 75 16 (!) 150/89 98 %     ED Course:       Procedures   Performed by: Pearson Forster, DO  Procedures      Disposition    Disposition: Condition stable  DC- Adult Discharges: All of the diagnostic tests were reviewed and questions answered. Diagnosis, care plan and treatment options were discussed.  The patient understands the instructions and will follow up as directed. The patients results have been reviewed with them.  They have been counseled regarding their diagnosis.  The patient verbally convey understanding and agreement of the signs, symptoms, diagnosis, treatment and prognosis and additionally agrees to follow up as recommended with their PCP in 24 - 48 hours.  They also agree with the care-plan and convey that all of their questions have been answered.  I have also put together some discharge instructions for them that include: 1) educational information regarding their diagnosis, 2) how to care for their diagnosis at home, as well a 3) list of reasons why they would want to return to the ED prior to their follow-up appointment, should their condition change.  DC-The patient was given verbal follow-up instructions    DISCHARGE PLAN:  1. Cannot display discharge medications since this patient is not currently admitted.    2.   Follow-up Information       Follow up With Specialties Details [facility]    [facility] Emergency Medicine Call  As needed, If symptoms worsen [facility]    Vevelyn Royals  L, MD Urology Schedule an appointment as soon as possible for a visit   [place] [id number]  [phone]          3.  Return to ED if worse   4.   Discharge Medication List as of 02/25/2021  8:33 PM       START taking these medications    Details   tamsulosin (Flomax) 0.4 mg capsule Take 1 Capsule by mouth daily for 10 days., Normal, Disp-10 Capsule, R-0     ondansetron hcl (Zofran) 4 mg tablet Take 1 Tablet by mouth every eight (8) hours as needed for Nausea or Vomiting., Normal, Disp-20 Tablet, R-0          Remove if  admitted/transferred    Diagnosis/Clinical Impression     Clinical Impression:   1. Kidney stone      Attestations: I, Pearson Forster, DO, am the primary clinician of record.    Please note that this dictation was completed with Dragon, the computer voice recognition software.  Quite often unanticipated grammatical, syntax, homophones, and other interpretive errors are inadvertently transcribed by the computer software.  Please disregard these errors.  Please excuse any errors that have escaped final proofreading.  Thank you.      Electronically signed by Pearson Forster, DO at 02/25/2021  9:42 PM EDT

## 2021-02-25 NOTE — ED Notes (Signed)
ED Notes by Ileana Roup, RN at 02/25/21 2011                Author: Ileana Roup, RN  Service: EMERGENCY  Author Type: Registered Nurse       Filed: 02/25/21 2018  Date of Service: 02/25/21 2011  Status: Signed          Editor: Ileana Roup, RN (Registered Nurse)               Pt requesting pain medication, MD updated.

## 2021-02-25 NOTE — ED Notes (Signed)
Formatting of this note might be different from the original.  Pt requesting pain medication, MD updated.  Electronically signed by Ileana Roup, RN at 02/25/2021  8:18 PM EDT

## 2021-02-26 LAB — URINALYSIS W/ REFLEX CULTURE
Bilirubin, Urine: NEGATIVE
Bilirubin: NEGATIVE
Blood, Urine: NEGATIVE
Blood: NEGATIVE
Glucose, Ur: NEGATIVE mg/dL
Glucose: NEGATIVE mg/dL
Ketone: NEGATIVE mg/dL
Ketones, Urine: NEGATIVE mg/dL
Leukocyte Esterase, Urine: NEGATIVE
Leukocyte Esterase: NEGATIVE
Nitrite, Urine: NEGATIVE
Nitrites: NEGATIVE
Protein, UA: NEGATIVE mg/dL
Protein: NEGATIVE mg/dL
Specific Gravity, UA: 1.02 (ref 1.003–1.030)
Specific gravity: 1.02 (ref 1.003–1.030)
Urobilinogen, UA, POCT: 2 EU/dL — ABNORMAL HIGH (ref 0.1–1.0)
Urobilinogen: 2 EU/dL — ABNORMAL HIGH (ref 0.1–1.0)
pH (UA): 5 (ref 5.0–8.0)
pH, UA: 5 (ref 5.0–8.0)

## 2021-02-26 MED ORDER — ONDANSETRON HCL 4 MG TAB
4 mg | ORAL_TABLET | Freq: Three times a day (TID) | ORAL | 0 refills | Status: AC | PRN
Start: 2021-02-26 — End: ?

## 2021-02-26 MED ORDER — TAMSULOSIN SR 0.4 MG 24 HR CAP
0.4 mg | ORAL_CAPSULE | Freq: Every day | ORAL | 0 refills | Status: AC
Start: 2021-02-26 — End: 2021-03-07

## 2021-10-06 NOTE — ED Provider Notes (Signed)
Formatting of this note is different from the original.  Time Seen By Providers [10/06/21 1653]  Attending Physician: Time Seen  PA/Res/NP : n/a  [facility] Provider for [facility] Patients/Nurse ONLY Visit: n/a    History  Chief Complaint   Patient presents with   ? Flank Pain       History of Present Illness:  HPI:   Christopher Peters is a 61 y.o. male complaining of mid and right low back pain for the last week intermittent.    associated without abdominal pain, fever, nausea, vomiting, diarrhea, dark black stool, burning with urination, urinary frequency, hematuria and change in B/B control, weakness, numbness, , with back pain only.    Using bactroban for leg lesion.    Alleviated with with change in position.    aggravated with nothing.  Urology: in [place], hasn't seen a urology in a year.   PCP Christopher Peters, in [place]  Pt spends the summers in [place] but doesn't have any dr in [place].   No back specialist.     Past Medical History:   Diagnosis Date   ? Hypertension    ? Kidney stone on left side        Past Surgical History:   Procedure Laterality Date   ? HX FRACTURE REPAIR      left thumb   ? HX KIDNEY REMOVAL      L nephrectomy for kidney stone. Was functioning at 18%    No family history on file.     Social History     Tobacco Use   ? Smoking status: Every Day     Packs/day: 1.00     Years: 35.00     Pack years: 35.00     Types: Cigarettes   ? Smokeless tobacco: Never   Substance Use Topics   ? Alcohol use: Yes     Comment: rarely   ? Drug use: No     Previous Medications    ATENOLOL (TENORMIN) 50 MG TABLET    Take 50 mg by mouth daily    CHOLECALCIFEROL (VITAMIN D3) 2,000 UNIT CAPSULE    Take 4,000 Units by mouth daily Note: 2000u x 2 = 4000u    HYDROCODONE-ACETAMINOPHEN (NORCO) 5-325 MG PER TABLET    Take 1 Tablet by mouth every 6 (six) hours as needed for Pain    OMEPRAZOLE (PRILOSEC) 20 MG CAPSULE    Take 20 mg by mouth daily     atenolol only, vit D  Blood thinners none    Allergies:   Allergies   Allergen  Reactions   ? Tylenol-Codeine #3 [Acetaminophen-Codeine] Hives   ? Codeine Itching     Previously tolerated Norco   ? Toradol [Ketorolac] Other (See Comments)     Pt told not to take d/t kidney issues    h    ROS:  All systems reviewed by way of the following symptoms and otherwise negative unless as noted to the contrary in the HPI.  (new fever, weight loss, vision changes, sore throat, earache, chest pain, palpitations, cough, dyspnea, Nausea, vomiting, diarrhea, melena, BRBPR, urinary frequency, dysuria, hematuria, abdominal pain, diaphoresis, rash, dizziness, headache, arthralgias, myalgias)    I have reviewed the nursing notes, patient's medications and allergies, PMH, social and family history and VS.    I have personally seen and evaluated Christopher Peters.    PHYSICAL EXAM:  ED Triage Vitals [10/06/21 1549]   Enc Vitals Group      BP 166/84  Pulse 103      Respirations 20      Temp 97.7 F (36.5 C)      Temp Source Oral      SpO2 100 %      Weight  124.7 kg (275 lb)      Height 1.753 m (5' 9" )      Head Circumference       Peak Flow       Pain Score 8      Pain Loc       Pain Edu?       Excl. in [place]?      Physical Exam  Constitutional:       General: He is not in acute distress.     Appearance: He is well-developed. He is not toxic-appearing or diaphoretic.   HENT:      Head: Normocephalic and atraumatic.      Nose: Nose normal.   Eyes:      General: No scleral icterus.        Right eye: No discharge.         Left eye: No discharge.      Conjunctiva/sclera: Conjunctivae normal.   Cardiovascular:      Rate and Rhythm: Normal rate and regular rhythm.      Heart sounds: Normal heart sounds.      No friction rub. No gallop.   Pulmonary:      Effort: Pulmonary effort is normal. No respiratory distress.      Breath sounds: Normal breath sounds. No stridor. No wheezing, rhonchi or rales.   Abdominal:      General: There is no distension.      Palpations: Abdomen is soft. There is no mass.      Tenderness:  There is no abdominal tenderness. There is no right CVA tenderness, left CVA tenderness, guarding or rebound.   Musculoskeletal:         General: Tenderness present. No deformity or signs of injury. Normal range of motion.      Cervical back: Normal range of motion and neck supple.      Right lower leg: No edema.      Left lower leg: No edema.      Comments: Tenderness R perilumbar back, no step off no rash.    Skin:     General: Skin is warm and dry.      Findings: No rash.   Neurological:      Mental Status: He is alert and oriented to person, place, and time.      Sensory: No sensory deficit.      Motor: No weakness.      Coordination: Coordination normal.      Gait: Gait normal.      Deep Tendon Reflexes: Reflexes normal.   Psychiatric:         Mood and Affect: Mood normal.         Behavior: Behavior normal.     Procedures    MDM:    Medications   cyclobenzaprine (FLEXERIL) tablet 5 mg (5 mg Oral Given 10/06/21 1730)       Independent historians in addition to patient: no    Review of external records: PDMP yes last script 11/2020, [facility] no, other epic reviewed seen at outside ER 5/1 for flank pain, no ureteral stone or kidney stone, urine culture <10,000, labs and urine all negative.  Seen 02/2021 without obstructing tiny stone without ureteral stone, pt has frequent ER visit  for flank pain without obstructing kidney stone seen, seen 9/12, 9/18 without obstruction, 5 ER visits in July without obstructing stone found.      Pt with recurrent chrnoic R flank pain with multiple visits without obstructing stone, last CT this month without any kindye stones anywhere, no concern for kidney stone with negative recent CT, will check labs, UA, and refer to spine triage. Labs ordered, no indication for repeat imaging.   No cancer (except basal cell on face), HIV, or IV drug use,   Or red flags,     Recent Vitals    10/06/21 1549   BP: 166/84   Pulse: 103   Resp: 20   Temp: 97.7 F (36.5 C)   TempSrc: Oral   SpO2: 100%    Weight: 124.7 kg (275 lb)   Height: 1.753 m (5' 9" )     PROBLEMS  Based on the patient's presenting signs/symptoms, the following is a list of HIGHLY MORBID and other conditions that I considered during my evaluation of this patient: Ddx:UTI, pyelo, back pain    Contributing chronic medical conditions; kidney stone, back pain chronic    DATA  I have ordered the following tests and reviewed the results:   Labs Reviewed   BASIC METABOLIC PANEL - Abnormal       Result Value    SODIUM 136      POTASSIUM 3.6      CHLORIDE 105      CARBON DIOXIDE 23.5      GLUCOSE 103 (*)     BUN 12      CREATININE 1.1      CALCIUM 9.5      ANION GAP 7.5      EGFR >60      Narrative:     This estimate should be used for screening purposes   only.  A creatinine clearance should be obtained   for use adjusting medication dosages or when   choosing potentially nephrotoxic medications.     Reported eGFR is being calculated using the   CKD-EPI 2021, using only creatinine equation   that does not use a race coefficient, and results   are in mL/min/1.41m.    COMPLETE BLOOD COUNT WITH AUTO DIFFERENTIAL - Abnormal    WBC 6.23      RBC 4.69      HGB 15.10      HCT 43.3      MCV 92.4      MCH 32.2      MCHC 34.9      RDW 14.7      PLT 132.7 (*)     MEAN PLAT. VOLUME 9.7      NEUTROPHIL % 63.3      LYMPHOCYTE % 26.1      MONOCYTE % 7.4      EOSINOPHIL % 2.6      BASOPHIL % 0.6      NEUTROPHIL ABS CT 3.9      ABS. NEUTROPHIL CT. 36,144.31     ABSOLUTE LYMPHOCYTE COUNT 1.6      ABSOLUTE MONOCYTE COUNT 0.5      ABSOLUTE EOSINOPHIL COUNT 0.2      ABSOLUTE BASOPHIL COUNT 0.0     COMPLETE BLOOD COUNT WITH DIFFERENTIAL    Narrative:     The following orders were created for panel order COMPLETE BLOOD COUNT WITH DIFFERENTIAL.  Procedure  Abnormality         Status                     ---------                               -----------         ------                     COMPLETE BLOOD COUNT WIT.Marland KitchenMarland Kitchen[[id number]]  Abnormal             Final result                 Please view results for these tests on the individual orders.   URINALYSIS WITH MICROSCOPIC EXAM   EXTRA TUBES    Narrative:     The following orders were created for panel order EXTRA TUBES.  Procedure                               Abnormality         Status                     ---------                               -----------         ------                     Wyonia Hough URINE CULTURE WJXB[[id number]]                         In process                   Please view results for these tests on the individual orders.   EXTRA URINE CULTURE TUBE     I have ordered the following imaging studies and reviewed the results:  No orders to display     I have independently interpreted the following imaging studies: no  and independently interpreted all laboratory testing: no significant laboratory abnormality, UA not resulted.      - - - - - - - - - - - - - - - - - - - - - - - - - - - - - - - - - - - - - - - - - - -    Clinical impression:  1. Back pain      Prescriptions:    Daily Medication List     You have not been prescribed any medications.      Follow-up:  No follow-up provider specified.    Pt wanting to leave AMA as UA taking too long to result. Called lab at 7:23 PM EDT for UA result., lab system changed today and running slower, UA is done running but won't be able to give me a result for a whilte still as it is in line, unable to pull up on their screen and tell me result over phone.  Pt walked out AMA prior to me being able to speak to him, IV was pulled out.     Degree of Medical Risk, risk of complication and or morbidity or mortality, decision regarding admission vs discharge home:    Pt  may have required admissino if he had pyelo, unfortunately, signed out prior to UA result    Clinical Impression:  1. Back pain        Armida Sans, MD  10/06/21 1928    Electronically signed by Armida Sans, MD at 10/06/2021  7:28 PM EDT

## 2021-10-06 NOTE — ED Triage Notes (Signed)
Formatting of this note might be different from the original.  Pt c/o left flank pain x1 week.    PMHx: nephrolithiasis   Electronically signed by Burns Spain, RN at 10/06/2021  3:51 PM EDT

## 2021-10-20 NOTE — ED Provider Notes (Signed)
Formatting of this note is different from the original.  Chief Complaint   Patient presents with   ? Back Pain     Pt c/o back pain x 1 month. Pt states mid back that radiates to R flank. Pt had L kidney removed from 60+ stones approx 4-5 years ago. Pt c/o dark urine. +burning with urination.      History of Present Illness  Patient presents with continued right low back pain.  Patient has had right back and flank pain for the past 1 month.  He has been evaluated at outside hospital emergency departments 3 prior times and he has had advanced imaging during these evaluations.  Last CT scan was at the beginning of May 2023 and it was unremarkable.  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  He has had a left nephrectomy previously due to kidney stones.  He is concerned that when he has right back or flank pain that this means that he could be going to kidney failure.  This morning his urine was more dark than usual.  He has some intermittent dysuria although no penile discharge or concerns for STIs.  His back pain is intermittent and is moderate and "grabs him" when it comes on.  This is sporadic and happens at rest and with activity.  He has not lifted anything heavy.  He denies history of IV drug use.  No urinary or fecal incontinence or saddle anesthesia.  No fever.  No leg numbness or weakness.  He has been referred to physical therapy previously although has not been able to get in yet.  No rash.  Patient is disappointed as he has tried Lidoderm patches and opioid medications for pain and the pain continues although the exact cause of this is unclear.    Patient History   Allergies   Allergen Reactions   ? Toradol [Ketorolac] Other (See Comments)     Only has one kidney, told not to take   ? Tylenol-Codeine #3 [Acetaminophen-Codeine] Other (See Comments)     Made "skin crawl"     Home Medications:   Prior to Admission medications    Medication Sig Start Date End Date Taking? Authorizing  Provider   atenoloL (TENORMIN) 50 mg Oral Tablet   0 Refill(s) 10/01/21  Yes Provider, Historical   atenoloL (TENORMIN) 50 mg Oral Tablet Take 50 mg by mouth daily. Indications: high blood pressure   Yes Provider, Historical   Cholecalciferol, Vitamin D3, 50 mcg (2,000 unit) Oral Capsule Take 4,000 Units by mouth daily.    Provider, Historical   gabapentin (NEURONTIN) 300 mg Oral Capsule Take 300 mg by mouth. 12/02/13   Provider, Historical     Past Medical History:   Past Medical History:   Diagnosis Date   ? Bilateral kidney stones    ? Hypertension    ? Melanoma ([place])    ? Renal disorder      Social History:   Social History     Socioeconomic History   ? Marital status: Married   Tobacco Use   ? Smoking status: Every Day     Packs/day: 1.00     Types: Cigarettes   ? Smokeless tobacco: Never   Vaping Use   ? Vaping Use: Never used   Substance and Sexual Activity   ? Alcohol use: Yes     Comment: social   ? Drug use: Not Currently     Family History: History reviewed.  No pertinent family history.  Surgical History:   Past Surgical History:   Procedure Laterality Date   ? KIDNEY REMOVAL Left    ? ORTHOPEDIC SURGERY           Review of Systems   Constitutional: Negative for fever.   HENT: Negative for sore throat.    Eyes: Negative for discharge.   Respiratory: Negative for cough and shortness of breath.    Cardiovascular: Negative for chest pain.   Gastrointestinal: Negative for abdominal pain and vomiting.   Genitourinary: Positive for dysuria and flank pain. Negative for hematuria, penile discharge, scrotal swelling and testicular pain.   Musculoskeletal: Positive for back pain.   Skin: Negative for rash.   Neurological: Negative for numbness and headaches.   Psychiatric/Behavioral: Negative for confusion.   All other systems reviewed and are negative.    ED Vitals    Date/Time BP Temp Temp src Pulse Resp SpO2 Height Weight ETCO2 (mmHg) User   10/20/21 1346 118/81 97.3 F (36.3 C) Oral 73 18 96 % 5' 9"  (1.753 m)  287 lb 11.2 oz (130.5 kg) -- MH       Body mass index is 42.49 kg/m.    Physical Exam  Vitals and nursing note reviewed.   Constitutional:       Appearance: Normal appearance. He is well-developed.   HENT:      Head: Normocephalic and atraumatic.      Mouth/Throat:      Mouth: Mucous membranes are moist.   Eyes:      Extraocular Movements: Extraocular movements intact.      Conjunctiva/sclera: Conjunctivae normal.   Neck:      Trachea: No tracheal deviation.   Cardiovascular:      Rate and Rhythm: Normal rate and regular rhythm.   Pulmonary:      Effort: Pulmonary effort is normal. No respiratory distress.   Abdominal:      General: There is no distension.      Palpations: Abdomen is soft.      Tenderness: There is no abdominal tenderness. There is no right CVA tenderness, left CVA tenderness or guarding.   Musculoskeletal:         General: No swelling or deformity. Normal range of motion.      Cervical back: Normal range of motion and neck supple.      Comments: No midline spinal tenderness or step-offs. He does have right paralumbar tenderness to palpation without overlying skin changes.  Normal strength bilateral legs.  Normal casual gait.  Equal distal pulses   Skin:     General: Skin is warm and dry.      Capillary Refill: Capillary refill takes less than 2 seconds.   Neurological:      General: No focal deficit present.      Mental Status: He is alert and oriented to person, place, and time.      Motor: No weakness.      Gait: Gait normal.   Psychiatric:         Behavior: Behavior normal.     Procedures    Medical Decision Making  Patient presents to the emergency department for right low back and right flank pain that has been present for over 1 month.  He does have right paralumbar tenderness to palpation which would suggest a muscular etiology.  However, this is a diagnosis of exclusion.  No skin changes to suggest cellulitis or shingles.  Normal strength in his legs and no midline  tenderness or history of  IV drug use that would raise my suspicion for an epidural abscess.  No risk factors for epidural hematoma.  No red flags for cauda equina.  His kidney function is at baseline.  UA without signs of infection.  Renal ultrasound without hydronephrosis or findings of obstruction.  Unclear cause of his continued back pain although ED work-up has been reassuring.  It is difficult to find a plan for him as he lives out of town.  He is returning to [place] in 2 weeks and so referral to physical therapy would not be able to be completed by that time.  He declines a lidocaine patch as he says that he is using for this pain and it has not helped. Will continue supportive care and he understands the importance of following up with his PCP and Urologist as soon as he returns home to [place] (he has already made appointments). Equal pulses to legs. He will return to ED for new symptoms.    Chronic right-sided low back pain without sciatica: complicated acute illness or injury  Amount and/or Complexity of Data Reviewed  Labs: ordered. Decision-making details documented in ED Course.  Radiology: ordered.    Risk  Prescription drug management.    Radiology/EKG/Labs:  Labs Reviewed   CBC WITH DIFF - Abnormal       Result Value    WBC 8.3      RBC 4.79      Hgb 15.2      Hct 42.7      MCV 89.1      MCH 31.7 (*)     MCHC 35.6      Platelet 157      MPV 10.7 (*)     Neut Percent 61.9      Lymph Percent 26.4      Mono Percent 7.7      Eos Percent 3.0      Baso Percent 0.6      RDW 44.4 (*)     NRBC Auto % 0.0      Imm Gran% 0.4      IMMGRAN# 0.0      Neut # 5.2      Lymph # 2.2      Mono # 0.6      Eos# 0.3      Baso # 0.1     BASIC METABOLIC PANEL - Abnormal    Sodium 139      Potassium 4.5      Chloride 104      Total CO2 24      Anion Gap 11      Calcium 9.0      BUN 16      Creatinine 1.31 (*)     Glucose Lvl 95      eGFR (CKD-EPIcr 2021) 63     HEPATIC FUNCTION PANEL - Normal    Total Protein 7.4      Albumin 4.4      Bili Direct  0.0      Bili Total 1.2      AST 34      ALT 34      Alk Phos 62     LIPASE LEVEL - Normal    Lipase Lvl 121     UA W/REFLEX TO CULTURE    Narrative:     The following orders were created for panel order UA W/REFLEX TO CULTURE.  Procedure  Abnormality         Status                     ---------                               -----------         ------                     Antony Haste RJJOAC[[id number]]                                Final result                 Please view results for these tests on the individual orders.   URINALYSIS REFLEX    UA Color Yellow      UA Appear Clear      UA Glucose Negative      UA Bili Negative      UA Ketones Negative      UA Blood Negative      UA pH 5.5      UA Protein Negative      UA Urobilinogen 1.0      UA Nitrite Negative      UA Leuk Est Negative      UA Spec Grav 1.025       XR LUMBAR SPINE AP AND LATERAL   Final Result     INDICATION:     Back pain for one month. Pain radiates to the right flank.     DISCUSSION:     AP and lateral views of the lumbar spine were obtained. No   fracture or subluxation is identified. The disc spaces are   maintained. There are facet degenerative changes in the   lower lumbar spine at the L4-5 and L5-S1 levels. There are   surgical clips on the left side of the abdomen from left   nephrectomy. There is a small amount of calcification in   the abdominal aorta.       IMPRESSION:     Intact lumbar spine.     Facet degenerative changes at L4-5 and L5-S1.     [place] RETROPERITONEAL LIMITED   Final Result     INDICATION:     61 year old male patient with back pain and right flank   pain. History of kidney stones and prior left nephrectomy.     COMPARISON: none     DISCUSSION:     Left kidney is surgically absent.     Right renal morphology is relatively preserved. Mild right   renal hypertrophy and hypertrophic column of Bertin. Right   kidney measures 14.5 cm in length. Small benign cortical   cyst noted posterior right kidney  measuring 3.5 cm.   No hydronephrosis or obstructive uropathy.       IMPRESSION:     Small benign 3.5 cm right renal cortical cysts.   No hydronephrosis or obstructive uropathy.       ED Course:    ED Course as of 10/20/21 1715   Diorio, Elaina's Documentation   Wed Oct 20, 2021   1437 Creatinine, Ser(!): 1.31  Fairly stable from baseline and prior ED visits at OSH (1.1 and 1.24)     Medications   sodium chloride 0.9% syringe 5 mL (has no administration in time range)  sodium chloride 0.9% IV line flush 20-250 mL (has no administration in time range)     Final diagnoses:   Chronic right-sided low back pain without sciatica (Primary)     Disposition     ED Disposition   Discharge    Condition   Stable    Comment   Guy Sandifer Cho discharge to home/self care.            Diorio, Ermalinda Barrios, MD  10/20/21 1727    Electronically signed by Little Ishikawa, MD at 10/20/2021  5:27 PM EDT

## 2021-10-31 NOTE — ED Provider Notes (Signed)
Formatting of this note is different from the original.  Images from the original note were not included.    First Contact       Date/Time Event User Comments Last Edited    10/31/21 0844 Time provider first saw patient [facility]. -- 10/31/21 0844         History     Chief Complaint   Patient presents with    Abdominal Pain     Pt states he was diagnosed with hernia 5 years ago after kidney removal surgery. Pt states he started having abd pain yesterday. Denies N/V/diarrhea     HPI  Patient is a 61 year old white male who presents with complaints of hernia pain. He states after he had his nephrectomy he was diagnosed with a hernia in his umbilical area.  He states from time to time it causes pain.  He states today it has been more persistent. He denies any nausea or vomiting. Last bowel movement he believes was yesterday or the day before. He is not felt constipated.  He denies any fevers or chills. Denies any falls or trauma.  Has not taken anything for the discomfort.  Denies dysuria or frequency of urination.  He had the prior nephrectomy secondary to kidney stones.  Past Medical History:   Diagnosis Date    Hernia, hiatal     Hypertension     Renal disorder     Umbilical hernia      Past Surgical History:   Procedure Laterality Date    FRACTURE SURGERY      left hand     kidney removal, left        No family history on file.    Social History     Tobacco Use    Smoking status: Every Day     Packs/day: 1.00     Types: Cigarettes    Smokeless tobacco: Never   Substance Use Topics    Alcohol use: No    Drug use: No     Allergies   Allergen Reactions    Codeine Anxiety    Nsaids (Non-Steroidal Anti-Inflammatory Drug) Other (See Comments)     Kidney removal     Toradol [Ketorolac] Other (See Comments)     Due to renal impairment     Prior to Admission medications    Medication Sig Start Date End Date Taking? Authorizing Provider   amoxicillin-clavulanate (AUGMENTIN) 875-125 mg tablet Take ONE tablet by mouth  every 12 (twelve) hours. 12/06/20   Garry Heater, MD   amoxicillin-clavulanate (AUGMENTIN) 875-125 mg tablet Take ONE tablet by mouth every 12 (twelve) hours. 12/10/20   Heloise Ochoa, MD   atenolol (TENORMIN) 50 MG tablet Take 50 mg by mouth daily.    Historical Provider, MD   HYDROcodone-acetaminophen (NORCO) 5-325 mg tablet Take ONE tablet by mouth every 6 (six) hours as needed for Pain. 12/06/20   Garry Heater, MD   lidocaine (LIDODERM) 5 % Place ONE patch onto the skin daily. Remove & Discard patch within 12 hours or as directed by MD 09/20/21   Christella Hartigan, PA     Review of Systems   Constitutional:  Negative for chills and fever.   Respiratory:  Negative for shortness of breath.    Cardiovascular:  Negative for chest pain.   Gastrointestinal:  Positive for abdominal pain. Negative for constipation, diarrhea, nausea and vomiting.   Genitourinary:  Negative for dysuria and frequency.     Physical Exam  Triage vitals signs - Temp: 98.5 F (36.9 C), Pulse: 68, Resp: 19, BP: 182/89, SpO2: 99 %    Physical Exam  Well-developed well-nourished white male no acute distress   Lungs clear to auscultation bilaterally   Heart regular rate rhythm   Abdomen soft he has tenderness kind of periumbilical I do not palpate a hernia at this point he has no rebound or guarding bowel sounds are normal  Back no CVA tenderness to percussion  ED Course     ED Lab Results       Procedure Component Value Units Date/Time    Basic metabolic panel [[id number]]  (Abnormal) Collected: 10/31/21 0845    Order Status: Completed Specimen: Not Stated Updated: 10/31/21 1003     Sodium 140 mmol/L      Potassium 4.3 mmol/L      Chloride 104 mmol/L      CO2 30 mmol/L      Glucose 109 mg/dL      Glucose -- mg/dL      If result of random glucose > or = 200   or if result of fasting glucose is > 125   confirm Diabetes Mellitus diagnosis with   second glucose on a different day.      BUN 18 mg/dL      Creatinine 1.29 mg/dL      eGFR --  mL/min/1.73 M2      64  Either of the following must be  present for >=3 months to be Chronic  Kidney Disease:     -GFR less than 60 for >=3 months     -Albumin to Creatinine Ratio >=30 mg/g      or other markers of kidney damage    An estimated GFR chronically in the   range of 60-89 is categorized as   mildly decreased, which corresponds   to Stage G2 CKD.    CKD-EPI equation (2021) used to  estimate GFR      Calcium 8.9 mg/dL     Narrative:      Testing by [place], [place] by [place] [id number]    CBC and differential [[id number]]  (Abnormal) Collected: 10/31/21 0845    Order Status: Completed Specimen: Not Stated Updated: 10/31/21 0956     WBC 6.0 K/CUMM      RBC 4.70 M/CUMM      Hemoglobin 15.1 g/dL      Hematocrit 44.1 %      MCV 93.8 fL      MCH 32.1 pg      MCHC 34.2 g/dL      RDW 13.5 %      Platelets 141 K/CUMM      MPV 10.9 fL      Segs Relative 55 %      Lymphocytes 32 %      Monocyte 8 %      Eosinophils 4 %      Basophils 1 %      Absolute Neutrophils 3.30 K/CUMM      ABSOLUTE LYMPHOCYTES 1.92 K/CUMM      Absolute Monocytes 0.48 K/CUMM      ABSOLUTE EOSINOPHILS 0.24 K/CUMM      ABSOLUTE BASOPHILS 0.06 K/CUMM      Diff Method Electronic WBC differential count    Narrative:      Testing by [place] [id number] by [employer] [phone]  [place] [address]    POC CREATININE WB WITH GFR (NON ORDERABLE) [[id number]]  (Abnormal) Collected: 10/31/21 0938    Order Status: Completed Updated: 10/31/21 0941     POC Creatinine 1.4 MG/DL      eGFR -- mL/min/1.73 M2      58    An estimated GFR chronically in the   range of 45-59 is categorized as   mildly to moderately decreased, which   corresponds to Stage G3a CKD.    CKD-EPI equation (2021) used to  estimate GFR     Narrative:      Testing by Celina [place], [place] [address]         Radiology Results       Procedure Component Value Units Date/Time    CT abdomen pelvis with IV contrast [[id number]] Collected: 10/31/21 1046    Order Status: Completed Updated: 10/31/21 1057    Narrative:      EXAM:  CT ABDOMEN AND PELVIS WITH CONTRAST    INDICATION:  periumbilical pain with hx of hernia    Tech Comments: Pt states he was diagnosed with hernia 5 years ago after kidney removal surgery. Pt states he started having abd pain yesterday    TECHNIQUE:  Low dose, multi-channel computerized tomography of the abdomen and pelvis was performed with IV contrast.  Multiplanar reformats were reviewed.    COMPARISON:  CT abdomen/pelvis dated 10/01/2021    FINDINGS:  LOWER CHEST: Lung bases are clear.  No acute findings.    LIVER: Normal morphology.  Hepatic steatosis.  No suspicious hepatic lesion.    BILIARY: Unremarkable.    PANCREAS: No evidence of mass or inflammation.    SPLEEN: Unremarkable.    ADRENALS AND KIDNEYS: Adrenal glands are normal.  Status post left nephrectomy.  No abnormal soft tissue in the nephrectomy bed.  Stable right renal cyst measuring 3.9 cm.  No hydronephrosis or nephrolithiasis.    GASTROINTESTINAL: No evidence of abnormal bowel wall thickening or obstruction. Colonic diverticula without evidence of diverticulitis.  Normal appendix.    VASCULAR: Abdominal aorta is normal in caliber. There is atherosclerotic disease.    LYMPH NODES: No pathologically enlarged lymph nodes.    PERITONEUM: No free air or ascites.    PELVIC ORGANS AND BLADDER: Unremarkable.    BODY WALL AND SOFT TISSUES: There is stable appearance of a supraumbilical fat containing hernia with ventral wall defect measuring 4.1 cm (series 5, image 73).  This is unchanged from the prior examinations.    BONES: No acute or suspicious abnormality.    IMPRESSION:  1. Stable appearance of a supraumbilical fat containing hernia with ventral wall defect  measuring 4.1 cm.  No evidence of complication.  2. Status post left nephrectomy.  3. Colonic diverticulosis without acute diverticulitis.  4. Hepatic steatosis.    Electronically Signed  By: Jon Billings M.D.  On: 10/31/2021 10:56:22 AM  On: [id number]          ED Medication Administration from 10/31/2021 0836 to 10/31/2021 1129         Date/Time Order Dose Route Action Action by     10/31/2021 1038 EDT iopamidoL (ISOVUE-370) 370 mg iodine /mL (76 %) injection 100 mL 100 mL Intravenous Given Weldon Inches, RT (R) (CT)         ED Vitals      Date  and Time Temp Pulse Resp BP SpO2 By   10/31/21 0840 98.5 F (36.9 C) 68 19 182/89 99 % HMG           Scoring Tools                                                  Procedures   Procedures    MDM   Medical Decision Making  Amount and/or Complexity of Data Reviewed  Labs: ordered.  Radiology: ordered.      Patient was initially evaluated. Hep-Lock inserted CBC was within normal limits.  Creatinine 1.29 otherwise basic metabolic panel within normal limits.  CT scan abdomen pelvis with IV contrast showed a stable periumbilical hernia containing only fat no bowel.  No other acute abnormalities in the abdomen.  On re-evaluation patient's abdomen is benign. Plan will be to discharge him to home. I have advised him follow up his primary doctor in 2 days.  He is return emergency department should symptoms worsen or any concerns.  Clinical Impression     1. Periumbilical abdominal pain      Disposition     New Prescriptions    No medications on file     ED Disposition       ED Disposition   Discharge    Date/Time   Sun Oct 31, 2021 11:29 AM    Comment   --           The patient was instructed to follow up with  your doctor    In 2 days          Jasmine December., MD  10/31/21 1130    Electronically signed by Jasmine December., MD at 10/31/2021 11:30 AM EDT

## 2021-12-05 ENCOUNTER — Emergency Department
Admission: EM | Admit: 2021-12-05 | Discharge: 2021-12-05 | Disposition: A | Discharge status: Home / Self Care | Payer: Medicaid Other | Attending: Emergency Medicine | Admitting: Emergency Medicine

## 2021-12-05 ENCOUNTER — Emergency Department (HOSPITAL_COMMUNITY): Payer: Medicaid Other

## 2021-12-05 ENCOUNTER — Other Ambulatory Visit: Payer: Self-pay

## 2021-12-05 DIAGNOSIS — R300 Dysuria: Secondary | ICD-10-CM | POA: Insufficient documentation

## 2021-12-05 DIAGNOSIS — R109 Unspecified abdominal pain: Secondary | ICD-10-CM | POA: Insufficient documentation

## 2021-12-05 DIAGNOSIS — N281 Cyst of kidney, acquired: Secondary | ICD-10-CM | POA: Insufficient documentation

## 2021-12-05 LAB — COMPREHENSIVE METABOLIC PANEL, NON-FASTING
ALBUMIN: 3.8 g/dL (ref 3.5–5.0)
ALKALINE PHOSPHATASE: 58 U/L (ref 45–115)
ALT (SGPT): 25 U/L (ref 10–55)
ANION GAP: 9 mmol/L (ref 4–13)
AST (SGOT): 18 U/L (ref 8–45)
BILIRUBIN TOTAL: 1 mg/dL (ref 0.3–1.3)
BUN/CREA RATIO: 14 (ref 6–22)
BUN: 16 mg/dL (ref 8–25)
CALCIUM: 9.4 mg/dL (ref 8.5–10.0)
CHLORIDE: 107 mmol/L (ref 96–111)
CO2 TOTAL: 23 mmol/L (ref 22–30)
CREATININE: 1.18 mg/dL (ref 0.75–1.35)
ESTIMATED GFR: 71 mL/min/BSA (ref >=60)
GLUCOSE: 96 mg/dL (ref 65–125)
POTASSIUM: 4 mmol/L (ref 3.5–5.1)
PROTEIN TOTAL: 7 g/dL (ref 6.4–8.3)
SODIUM: 139 mmol/L (ref 136–145)

## 2021-12-05 LAB — URINALYSIS WITH MICROSCOPIC REFLEX IF INDICATED BMC/JMC ONLY
BILIRUBIN: NEGATIVE mg/dL
BLOOD: NEGATIVE mg/dL
GLUCOSE: NEGATIVE mg/dL
LEUKOCYTES: NEGATIVE WBCs/uL
NITRITE: NEGATIVE
PH: 5.5 (ref <8.0)
PROTEIN: NEGATIVE mg/dL
SPECIFIC GRAVITY: 1.025 — ABNORMAL HIGH (ref <1.022)
UROBILINOGEN: 1 mg/dL (ref <=2.0)

## 2021-12-05 LAB — CBC WITH DIFF
BASOPHIL #: <0.1 10*3/uL (ref <=0.20)
BASOPHIL %: 1 %
EOSINOPHIL #: 0.21 10*3/uL (ref <=0.50)
EOSINOPHIL %: 4 %
HCT: 44.2 % (ref 38.9–52.0)
HGB: 15.2 g/dL (ref 13.4–17.5)
IMMATURE GRANULOCYTE #: <0.1 10*3/uL (ref <0.10)
IMMATURE GRANULOCYTE %: 0 % (ref 0–1)
LYMPHOCYTE #: 1.81 10*3/uL (ref 1.00–4.80)
LYMPHOCYTE %: 31 %
MCH: 32 pg (ref 26.0–32.0)
MCHC: 34.4 g/dL (ref 31.0–35.5)
MCV: 93.1 fL (ref 78.0–100.0)
MONOCYTE #: 0.48 10*3/uL (ref 0.20–1.10)
MONOCYTE %: 8 %
NEUTROPHIL #: 3.32 10*3/uL (ref 1.50–7.70)
NEUTROPHIL %: 56 %
PLATELETS: 126 10*3/uL — ABNORMAL LOW (ref 150–400)
RBC: 4.75 10*6/uL (ref 4.50–6.10)
RDW-CV: 12.7 % (ref 11.5–15.5)
WBC: 5.9 10*3/uL (ref 3.7–11.0)

## 2021-12-05 LAB — LIPASE: LIPASE: 21 U/L (ref 10–60)

## 2021-12-05 MED ORDER — TIZANIDINE 4 MG TABLET
4.0000 mg | ORAL_TABLET | Freq: Three times a day (TID) | ORAL | 0 refills | Status: AC | PRN
Start: 2021-12-05 — End: 2021-12-12

## 2021-12-05 MED ORDER — ACETAMINOPHEN 500 MG TABLET
1000.0000 mg | ORAL_TABLET | ORAL | Status: AC
Start: 2021-12-05 — End: 2021-12-05
  Administered 2021-12-05: 1000 mg via ORAL
  Filled 2021-12-05: qty 2

## 2021-12-05 MED ORDER — DIAZEPAM 5 MG TABLET
5.0000 mg | ORAL_TABLET | Freq: Four times a day (QID) | ORAL | Status: DC | PRN
Start: 2021-12-05 — End: 2021-12-05
  Administered 2021-12-05: 5 mg via ORAL
  Filled 2021-12-05: qty 1

## 2021-12-05 MED ORDER — IBUPROFEN 400 MG TABLET
400.0000 mg | ORAL_TABLET | Freq: Three times a day (TID) | ORAL | Status: AC | PRN
Start: 2021-12-05 — End: 2021-12-19

## 2021-12-05 MED ORDER — ACETAMINOPHEN 500 MG TABLET
1000.0000 mg | ORAL_TABLET | Freq: Three times a day (TID) | ORAL | Status: AC | PRN
Start: 2021-12-05 — End: ?

## 2021-12-05 NOTE — ED Nurses Note (Signed)
Called for patient, patient in CT

## 2021-12-05 NOTE — ED Triage Notes (Signed)
R sided flank pain x4 days radiating into R groin. Reports dark urine, some burning with urination and urinary frequency

## 2021-12-05 NOTE — ED Nurses Note (Signed)
Patient is unable to provide a urine sample at this time.

## 2021-12-05 NOTE — ED Provider Notes (Signed)
Peter Minium, MD  [facility] - Emergency Department  ED Attending Note    Chief Complaint   Patient presents with    Flank Pain         HISTORY OF PRESENT ILLNESS     Richard Cooper, date of birth [birth year], is a 61 y.o.male who presents to the Emergency Department with worsening right flank pain onset last night which radiates into the right groin. He also reports darker urine, nausea, and dysuria. He denies fever, chills, hematuria, and emesis. He states that 4-5 years ago he had a left nephrectomy performed on him due to kidney stones, and that his current pain and symptoms are similar.                                                    PATIENT HISTORY     Past Medical History:  No past medical history on file.    Past Surgical History:  No past surgical history on file.    Family History:  Family Medical History:    None       Social History:       Medications:  No current outpatient medications on file.     Allergies:  No Known Allergies                                                   PHYSICAL EXAM     Vitals:  ED Triage Vitals   BP (Non-Invasive) 12/05/21 0952 (!) 172/86   Heart Rate 12/05/21 0952 76   Respiratory Rate 12/05/21 0952 20   Temperature 12/05/21 0952 36.1 C (97 F)   SpO2 12/05/21 0952 99 %   Weight 12/05/21 0952 120 kg (264 lb)   Height 12/05/21 0952 1.753 m (5\' 9" )       Constitutional: The patient is alert, oriented, non toxic  Head: Atraumatic, normocephalic  Chest: Atraumatic, no tenderness to palpation, clear and equal breath sounds bilaterally. No wheezes, rhonchi or rales.  No accessory muscle use.  Cardiovascular: Heart is S1-S2, regular rate and rhythm without murmur, click, gallop or rub.  Abdomen: Soft, non-tender, non-distended without evidence of rebound or guarding, no gross organomegaly or other palpatory masses.   Skin: No cyanosis, jaundice, rash or lesion.  Neurologic: Alert, oriented, normal facial symmetry and speech, Normal upper and lower extremity  strength and grossly normal sensation.   Musculoskeletal: No deformities, trauma, edema.  Vascular: Normal peripheral pulses 4/4 with brisk capillary refills of less than 2 seconds.                                                DIAGNOSTIC STUDIES     Labs:    Results for orders placed or performed during the hospital encounter of 12/05/21   COMPREHENSIVE METABOLIC PANEL, NON-FASTING   Result Value Ref Range    SODIUM 139 136 - 145 mmol/L    POTASSIUM 4.0 3.5 - 5.1 mmol/L    CHLORIDE 107 96 - 111 mmol/L    CO2 TOTAL 23 22 - 30  mmol/L    ANION GAP 9 4 - 13 mmol/L    BUN 16 8 - 25 mg/dL    CREATININE 4.40 1.02 - 1.35 mg/dL    BUN/CREA RATIO 14 6 - 22    ESTIMATED GFR 71 >=60 mL/min/BSA    ALBUMIN 3.8 3.5 - 5.0 g/dL     CALCIUM 9.4 8.5 - 72.5 mg/dL    GLUCOSE 96 65 - 366 mg/dL    ALKALINE PHOSPHATASE 58 45 - 115 U/L    ALT (SGPT) 25 10 - 55 U/L    AST (SGOT)  18 8 - 45 U/L    BILIRUBIN TOTAL 1.0 0.3 - 1.3 mg/dL    PROTEIN TOTAL 7.0 6.4 - 8.3 g/dL   LIPASE   Result Value Ref Range    LIPASE 21 10 - 60 U/L   URINALYSIS WITH MICROSCOPIC REFLEX IF INDICATED [facility] ONLY   Result Value Ref Range    COLOR Yellow Light Yellow, Straw, Yellow    APPEARANCE Clear Clear    PH 5.5 <8.0    LEUKOCYTES Negative Negative WBCs/uL    NITRITE Negative Negative    PROTEIN Negative Negative, 10  mg/dL    GLUCOSE Negative Negative mg/dL    KETONES Trace (A) Negative mg/dL    UROBILINOGEN 1.0 <=4.4 mg/dL    BILIRUBIN Negative Negative mg/dL    BLOOD Negative Negative mg/dL    SPECIFIC GRAVITY 0.347 (H) <1.022   CBC WITH DIFF   Result Value Ref Range    WBC 5.9 3.7 - 11.0 x10^3/uL    RBC 4.75 4.50 - 6.10 x10^6/uL    HGB 15.2 13.4 - 17.5 g/dL    HCT 42.5 95.6 - 38.7 %    MCV 93.1 78.0 - 100.0 fL    MCH 32.0 26.0 - 32.0 pg    MCHC 34.4 31.0 - 35.5 g/dL    RDW-CV 56.4 33.2 - 95.1 %    PLATELETS 126 (L) 150 - 400 x10^3/uL    NEUTROPHIL % 56 %    LYMPHOCYTE % 31 %    MONOCYTE % 8 %    EOSINOPHIL % 4 %    BASOPHIL % 1 %    NEUTROPHIL # 3.32 1.50 -  7.70 x10^3/uL    LYMPHOCYTE # 1.81 1.00 - 4.80 x10^3/uL    MONOCYTE # 0.48 0.20 - 1.10 x10^3/uL    EOSINOPHIL # 0.21 <=0.50 x10^3/uL    BASOPHIL # <0.10 <=0.20 x10^3/uL    IMMATURE GRANULOCYTE % 0 0 - 1 %    IMMATURE GRANULOCYTE # <0.10 <0.10 x10^3/uL     Labs reviewed and interpreted by me.    Radiology:    CT ABDOMEN PELVIS WO IV CONTRAST   Final Result   No acute findings. Absent left kidney. Cyst right kidney.      The CT exam was performed using one or more of the following dose reduction techniques: Automated exposure control, adjustment of the mA and/or kV according to the patient's size, or use of iterative reconstruction technique.      Radiologist location ID: [id number]        I independently reviewed and interpreted the patients imaging. I also reviewed the radiology report provided by the radiologist.                                         MEDICAL DECISION MAKING  Medications Administered in the ED   diazePAM (VALIUM) tablet (5 mg Oral Given 12/05/21 1150)   acetaminophen (TYLENOL) tablet (1,000 mg Oral Given 12/05/21 1150)       ED Course as of 12/13/21 1002   Mon Dec 13, 2021   1001 URINALYSIS WITH MICROSCOPIC REFLEX IF INDICATED [facility] ONLY(!)  No UTI   1001 LIPASE  No pancreatitis   1001 CBC/DIFF(!)  No leukocytosis or anemia   1001 COMPREHENSIVE METABOLIC PANEL, NON-FASTING  No electrolyte derangement or renal insufficiency   1001 CT ABDOMEN PELVIS WO IV CONTRAST  No acute findings. Absent left kidney. Cyst right kidney.        Medical Decision Making  Richard Cooper, date of birth 09-13-1960, is a 61 y.o.male who presents to the Emergency Department with worsening right flank pain onset last night which radiates into the right groin. He also reports darker urine, nausea, and dysuria. He denies fever, chills, hematuria, and emesis. He states that 4-5 years ago he had a left nephrectomy performed on him due to kidney stones, and that his current pain and symptoms are similar. He has no past  medical, surgical, or social history on final. He has no home medications or allergies on file.    Amount and/or Complexity of Data Reviewed  Labs: ordered. Decision-making details documented in ED Course.  Radiology: ordered. Decision-making details documented in ED Course.  ECG/medicine tests: independent interpretation performed.    Risk  OTC drugs.  Prescription drug management.  Risk Details: Patient has no acute surgical process on CT scan.  Patient has no evidence for UTI.  Patient improved with medications in the emergency department.  I will discharge him home with medication for symptomatic.  If the patient's symptoms worsen he can return anytime to the emergency department.        Pre-Disposition Vitals:  Filed Vitals:    12/05/21 0952 12/05/21 0952 12/05/21 1244   BP:  (!) 172/86 (!) 162/70   Pulse: 76 72 78   Resp: 20  18   Temp: 36.1 C (97 F)     SpO2:  99% 98%                                               CLINICAL IMPRESSION     Clinical Impression   Flank pain (Primary)                                                DISPOSITION PLAN     Discharged    Prescriptions:     Discharge Medication List as of 12/05/2021 12:44 PM        START taking these medications    Details   acetaminophen (TYLENOL) 500 mg Oral Tablet Take 2 Tablets (1,000 mg total) by mouth Every 8 hours as needed for Pain, OTC      ibuprofen (MOTRIN) 400 mg Oral Tablet Take 1 Tablet (400 mg total) by mouth Three times a day as needed for Pain for up to 14 days, OTC      tiZANidine (ZANAFLEX) 4 mg Oral Tablet Take 1 Tablet (4 mg total) by mouth Three times a day as needed for up to 7 days, Disp-21 Tablet, R-0, Print  Follow-Up:     Niel Hummer, MD  [address]  [address] [address]  [phone]    Schedule an appointment as soon as possible for a visit in 3 days    Virgia Land, MD  [address]  [place] [address]  [phone]    Schedule an appointment as soon as possible for a visit in 3  days    Condition at Disposition: Stable    I, Bobbye Poteet, SCRIBE, scribed for Peter Minium, MD on 12/05/2021 at 9:53 AM     Documentation assistance provided for Peter Minium, MD by scribe Bobbye [place], SCRIBE, SCRIBE. Information recorded by the scribe was done at my direction and has been reviewed and validated by me, Peter Minium, MD.

## 2021-12-05 NOTE — ED Nurses Note (Signed)
Patient discharged home with family.  AVS reviewed with patient/care giver.  A written copy of the AVS and discharge instructions was given to the patient/care giver. Scripts handed to patient/care giver. Questions sufficiently answered as needed.  Patient/care giver encouraged to follow up with PCP as indicated.  In the event of an emergency, patient/care giver instructed to call 911 or go to the nearest emergency room.     Current Discharge Medication List        START taking these medications.        Details   acetaminophen 500 mg Tablet  Commonly known as: TYLENOL   1,000 mg, Oral, EVERY 8 HOURS PRN  Refills: 0     ibuprofen 400 mg Tablet  Commonly known as: MOTRIN   400 mg, Oral, 3 TIMES DAILY PRN  Refills: 0     tiZANidine 4 mg Tablet  Commonly known as: ZANAFLEX   4 mg, Oral, 3 TIMES DAILY PRN  Qty: 21 Tablet  Refills: 0

## 2021-12-22 NOTE — Unmapped (Signed)
Formatting of this note is different from the original.  Christopher Peters is a 61 y.o. male presenting with   Chief Complaint   Patient presents with    Flank Pain     ED Triage Notes    Intermittent pain by 2 weeks history of kidney stones history of left nephrectomy due to a stone complication 5 years ago.  Mild nausea without vomiting.      I have seen and evaluated Christopher Peters for   Chief Complaint   Patient presents with    Flank Pain     I have reviewed vital signs    PE    General: Well appearing, no acute distress  Head: Normocephalic, atraumatic  Resp: No respiratory distress  Musc: Moves all extremities equally  Skin: Warm and dry  Neuro: No focal or unilateral deficits    I have ordered appropriate testing for Christopher Peters and this patient is now awaiting further evaluation by ED Provider      Electronically signed by Rosezena Sensor, PA at 12/22/2021  4:12 PM EDT

## 2021-12-22 NOTE — ED Triage Notes (Signed)
Formatting of this note might be different from the original.  Pt states right flank pain intermittently x 2 weeks. Pt states hx of kidney stones, left kidney removed due to stones 5 years ago. Pt states nausea but no vomiting.  Electronically signed by Corliss Parish at 12/22/2021  4:12 PM EDT

## 2021-12-22 NOTE — ED Provider Notes (Signed)
Formatting of this note is different from the original.    HISTORY OF PRESENT ILLNESS   Christopher Peters, a 61 y.o. male presents to the ED with a Chief Complaint of Flank Pain    Subjective     61 year old male complains of moderate right flank pain for 2 weeks.  Noted that urine was a little darker than usual.  Prior nephrectomy on left side due to prior kidney stones and worried that it could be the same.  Denies fever, vomiting, diarrhea, dysuria, chest pain, shortness of breath.    Flank Pain  Associated symptoms: no chest pain, no chills, no cough, no diarrhea, no dysuria, no fever, no hematuria, no shortness of breath, no sore throat and no vomiting      REVIEW OF SYSTEMS   Review of Systems   Constitutional:  Negative for chills and fever.   HENT:  Negative for sore throat and trouble swallowing.    Eyes:  Negative for pain and redness.   Respiratory:  Negative for cough and shortness of breath.    Cardiovascular:  Negative for chest pain and palpitations.   Gastrointestinal:  Negative for abdominal pain, diarrhea and vomiting.   Genitourinary:  Positive for flank pain. Negative for dysuria and hematuria.   Musculoskeletal:  Negative for back pain and neck pain.   Skin:  Negative for rash and wound.   Neurological:  Negative for weakness, numbness and headaches.     PAST MEDICAL HISTORY REVIEWED     MEDICAL:  Patient  has a past medical history of Calculus of kidney, Cancer, and HTN (hypertension).    SURGICAL:  Patient  has a past surgical history that includes hx nephrectomy (Left).   FAMILY:  Patient's family history is not on file.   SOCIAL:   reports that he has been smoking cigarettes. He does not have any smokeless tobacco history on file.  No birth history on file.  Social History     Other Topics Concern    Not on file     PROBLEM LIST:  Patient does not have a problem list on file.    ALLERGIES  Acetaminophen-codeine, Ketorolac, and Nsaids (non-steroidal anti-inflammatory drug)    HOME  MEDICATIONS   There are no discharge medications for this patient.    Objective     PHYSICAL EXAM   INITIAL VS  BP: (!) 182/93 (12/22/21 1615), Heart Rate: 66 bpm (12/22/21 2032), Pulse: 64 (12/22/21 1615), Resp: 22 (12/22/21 1615), Temp: 97.9 F (36.6 C) (12/22/21 1615), Temp src: (not recorded), SpO2: 98 % (12/22/21 1615), Height: 5\' 9"  (175.3 cm) (12/22/21 1615), Weight: 132.8 kg (292 lb 12.3 oz) (12/22/21 1615), BMI (Calculated): 43.21 (12/22/21 1615) No LMP for male patient.    Physical Exam  Vitals reviewed.   Constitutional:       Appearance: Normal appearance.   HENT:      Head: Normocephalic and atraumatic.      Nose: Nose normal.      Mouth/Throat:      Pharynx: Oropharynx is clear.   Eyes:      Extraocular Movements: Extraocular movements intact.      Conjunctiva/sclera: Conjunctivae normal.      Pupils: Pupils are equal, round, and reactive to light.   Cardiovascular:      Rate and Rhythm: Normal rate and regular rhythm.      Heart sounds: No murmur heard.  Pulmonary:      Effort: Pulmonary effort is normal.  Breath sounds: Normal breath sounds.   Abdominal:      Palpations: Abdomen is soft.      Tenderness: There is no abdominal tenderness. There is right CVA tenderness.   Musculoskeletal:         General: No tenderness or signs of injury. Normal range of motion.      Cervical back: Normal range of motion and neck supple. No muscular tenderness.   Skin:     General: Skin is warm and dry.      Capillary Refill: Capillary refill takes less than 2 seconds.      Findings: No rash.   Neurological:      General: No focal deficit present.      Mental Status: He is alert and oriented to person, place, and time.   Psychiatric:         Mood and Affect: Mood normal.         Behavior: Behavior normal.     DIAGNOSTICS   LAB:    Labs this ED Encounter   CBC WITH DIFFERENTIAL - Abnormal       Result Value    WBC 6.6      RBC 4.72      HEMOGLOBIN 15.2      HEMATOCRIT 44.0      MCV 93.2      MCH 32.2      MCHC 34.5       RDW 12.5      RDW-STDEV 42.8      PLATELETS 129 (*)     MPV 11.0      NEUTROPHILS 61      LYMPHOCYTES 28      MONOCYTES 7      EOSINOPHILS 4      BASOPHILS 1      IMMATURE GRANULOCYTES 1      NEUTROPHIL ABSOLUTE 4.00      LYMPHOCYTE ABSOLUTE 1.82      MONOCYTE ABSOLUTE 0.47      EOSINOPHIL ABSOLUTE 0.23      BASOPHILS ABSOLUTE 0.03      IMMATURE GRANULOCYTES ABSOLUTE 0.03     URINALYSIS WITH REFLEX CULTURE - Abnormal    COLOR UA Yellow      CLARITY UA Cloudy (*)     SPECIFIC GRAVITY UA 1.022      PH UA 5.5      LEUKOCYTE ESTERASE, UA Negative      NITRITE UA Negative      PROTEIN, UA Trace (*)     GLUCOSE, UA Negative      KETONES, UA Negative      UROBILINOGEN UA 1.0      BILIRUBIN, UA Negative      BLOOD, UA Negative     URINALYSIS MICROSCOPY ONLY - Normal    WBC UA 0-5      RBC UA 0-2      BACTERIA UA None Seen      EPITHELIAL CELLS, URINE 0-2      HYALINE CAST 0-2      Narrative:     Culture not Indicated.  Please order [id number], Urine Culture, if clinically indicated.   COMPREHENSIVE METABOLIC PANEL    SODIUM 140      POTASSIUM 3.7      CHLORIDE 107      CO2 26      CALCIUM 9.5      BUN 12      CREATININE 1.17      GLUCOSE 92  TOTAL PROTEIN 7.5      ALBUMIN 4.6      BILIRUBIN TOTAL 1.0      ALKALINE PHOSPHATASE 55      AST 18      ALT 21      GFR >60      ANION GAP 7       RADIOLOGY:   CT ABDOMEN PELVIS WO CONTRAST   Radiologist Impression   Impression:    1.  No acute process within the abdomen or pelvis.   2.  Prior left nephrectomy. An area of fat necrosis is seen at the left nephrectomy bed adjacent to some surgical clips.   3.  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   4.  Additional chronic findings as above.        EKG:     PROCEDURES   Procedures      MEDICAL DECISION MAKING AND PLAN OF CARE    Medical Decision Making  Patient presenting with right flank pain.  Mild right CVA tenderness on exam.  Urine without infection.  CT negative for passing stone or other  significant pathology.  No focal neuro deficits on exam.  No indication for further work-up or hospitalization at this time.    Clinical Impressions as of 12/23/21 0016   Right flank pain                                                       There are no discharge medications for this patient.    LAST VS    BP: (!) 159/78 (12/22/21 2032), Heart Rate: 66 bpm (12/22/21 2032), Pulse: 66 (12/22/21 2032), Resp: 22 (12/22/21 1615), Temp: 97.9 F (36.6 C) (12/22/21 1615), Temp src: (not recorded), SpO2: 98 % (12/22/21 1615)     CLINICAL IMPRESSION   Final diagnoses:   [R10.9] Right flank pain (Primary)     DISPOSITION, EDUCATION AND MEDICATION RECONCILIATION   Medications reconciled.  See after visit summary for patient education on discharged patients.    ED Disposition       ED Disposition   Discharge    Condition   Stable    User   Judson Roch, [place]    Date/Time   Wed Dec 22, 2021  8:23 PM    Comment   --           ATTESTATION STATEMENTS      Electronically signed by Judson Roch, PA at 12/23/2021 12:16 AM EDT

## 2022-01-02 ENCOUNTER — Emergency Department: Payer: Medicaid - Out of State

## 2022-01-02 ENCOUNTER — Emergency Department
Admission: EM | Admit: 2022-01-02 | Discharge: 2022-01-02 | Disposition: A | Discharge status: Home / Self Care | Payer: Medicaid - Out of State | Attending: Emergency Medicine | Admitting: Emergency Medicine

## 2022-01-02 DIAGNOSIS — R109 Unspecified abdominal pain: Secondary | ICD-10-CM | POA: Insufficient documentation

## 2022-01-02 DIAGNOSIS — L723 Sebaceous cyst: Secondary | ICD-10-CM | POA: Insufficient documentation

## 2022-01-02 LAB — COMPREHENSIVE METABOLIC PANEL
ALT: 20 U/L (ref 0–55)
AST (SGOT): 19 U/L (ref 10–42)
Albumin/Globulin Ratio: 1.06 Ratio (ref 0.80–2.00)
Albumin: 3.8 gm/dL (ref 3.5–5.0)
Alkaline Phosphatase: 69 U/L (ref 40–145)
Anion Gap: 15.9 mMol/L (ref 7.0–18.0)
BUN / Creatinine Ratio: 11.4 Ratio (ref 10.0–30.0)
BUN: 13 mg/dL (ref 7–22)
Bilirubin, Total: 1.2 mg/dL (ref 0.1–1.2)
CO2: 21 mMol/L (ref 20–30)
Calcium: 9.2 mg/dL (ref 8.5–10.5)
Chloride: 107 mMol/L (ref 98–110)
Creatinine: 1.14 mg/dL (ref 0.80–1.30)
EGFR: 74 mL/min/{1.73_m2} (ref 60–150)
Globulin: 3.6 gm/dL (ref 2.0–4.0)
Glucose: 176 mg/dL — ABNORMAL HIGH (ref 71–99)
Osmolality Calculated: 284 mOsm/kg (ref 275–300)
Potassium: 3.9 mMol/L (ref 3.5–5.3)
Protein, Total: 7.4 gm/dL (ref 6.0–8.3)
Sodium: 140 mMol/L (ref 136–147)

## 2022-01-02 LAB — VH URINALYSIS WITH MICROSCOPIC AND CULTURE IF INDICATED
Bilirubin, UA: NEGATIVE
Blood, UA: NEGATIVE
Glucose, UA: NEGATIVE mg/dL
Ketones UA: NEGATIVE mg/dL
Leukocyte Esterase, UA: NEGATIVE Leu/uL
Nitrite, UA: NEGATIVE
Protein, UR: NEGATIVE mg/dL
Urine Specific Gravity: 1.021 (ref 1.001–1.040)
Urobilinogen, UA: 4 mg/dL — AB
WBC, UA: 1 /hpf (ref 0–4)
pH, Urine: 5 pH (ref 5.0–8.0)

## 2022-01-02 LAB — CBC AND DIFFERENTIAL
Basophils %: 0.3 % (ref 0.0–3.0)
Basophils Absolute: 0 10*3/uL (ref 0.0–0.3)
Eosinophils %: 3 % (ref 0.0–7.0)
Eosinophils Absolute: 0.2 10*3/uL (ref 0.0–0.8)
Hematocrit: 46.9 % (ref 39.0–52.5)
Hemoglobin: 15.5 gm/dL (ref 13.0–17.5)
Lymphocytes Absolute: 1.6 10*3/uL (ref 0.6–5.1)
Lymphocytes: 25.4 % (ref 15.0–46.0)
MCH: 32 pg (ref 28–35)
MCHC: 33 gm/dL (ref 32–36)
MCV: 97 fL (ref 80–100)
MPV: 8.9 fL (ref 6.0–10.0)
Monocytes Absolute: 0.3 10*3/uL (ref 0.1–1.7)
Monocytes: 4.5 % (ref 3.0–15.0)
Neutrophils %: 66.7 % (ref 42.0–78.0)
Neutrophils Absolute: 4.2 10*3/uL (ref 1.7–8.6)
PLT CT: 131 10*3/uL (ref 130–440)
RBC: 4.84 10*6/uL (ref 4.00–5.70)
RDW: 12 % (ref 11.0–14.0)
WBC: 6.3 10*3/uL (ref 4.0–11.0)

## 2022-01-02 LAB — VH EXTRA SPECIMEN LABEL

## 2022-01-02 MED ORDER — ONDANSETRON HCL 4 MG/2ML IJ SOLN
4.0000 mg | Freq: Once | INTRAMUSCULAR | Status: AC
Start: 2022-01-02 — End: 2022-01-02
  Administered 2022-01-02: 4 mg via INTRAVENOUS

## 2022-01-02 MED ORDER — VH SODIUM CHLORIDE 0.9 % IV BOLUS
1000.0000 mL | Freq: Once | INTRAVENOUS | Status: AC
Start: 2022-01-02 — End: 2022-01-02
  Administered 2022-01-02: 1000 mL via INTRAVENOUS

## 2022-01-02 MED ORDER — TIZANIDINE HCL 2 MG PO TABS
2.0000 mg | ORAL_TABLET | Freq: Four times a day (QID) | ORAL | 0 refills | Status: AC | PRN
Start: 2022-01-02 — End: ?

## 2022-01-02 MED ORDER — AMOXICILLIN-POT CLAVULANATE 875-125 MG PO TABS
1.0000 | ORAL_TABLET | Freq: Two times a day (BID) | ORAL | 0 refills | Status: AC
Start: 2022-01-02 — End: 2022-01-09

## 2022-01-02 MED ORDER — ONDANSETRON HCL 4 MG/2ML IJ SOLN
INTRAMUSCULAR | Status: AC
Start: 2022-01-02 — End: ?
  Filled 2022-01-02: qty 2

## 2022-01-02 MED ORDER — MORPHINE SULFATE 4 MG/ML IJ/IV SOLN (WRAP)
4.0000 mg | Freq: Once | Status: DC
Start: 2022-01-02 — End: 2022-01-02

## 2022-01-02 MED ORDER — DIAZEPAM 5 MG PO TABS
ORAL_TABLET | ORAL | Status: AC
Start: 2022-01-02 — End: ?
  Filled 2022-01-02: qty 1

## 2022-01-02 MED ORDER — DIAZEPAM 5 MG PO TABS
5.0000 mg | ORAL_TABLET | Freq: Once | ORAL | Status: AC
Start: 2022-01-02 — End: 2022-01-02
  Administered 2022-01-02: 5 mg via ORAL

## 2022-01-02 MED ORDER — KETOROLAC TROMETHAMINE 15 MG/ML IJ SOLN
15.0000 mg | Freq: Once | INTRAMUSCULAR | Status: DC
Start: 2022-01-02 — End: 2022-01-02

## 2022-01-02 MED ORDER — LIDOCAINE 5 % EX PTCH
1.0000 | MEDICATED_PATCH | CUTANEOUS | 0 refills | Status: AC
Start: 2022-01-02 — End: ?

## 2022-01-02 NOTE — ED Provider Notes (Signed)
Formatting of this note is different from the original.  Images from the original note were not included.  [place]  History and Physical Exam      Patient Name: Christopher Peters, Christopher Peters  Encounter Date:  01/02/2022  Physician Assistant: Melynda Keller, PA-C  Attending Physician: Sandie Ano, MD  PCP: Nathanial Rancher, MD  Patient DOB:  [birth year]  MRN:  [id number]  Room:  E52/E52-A    History of Presenting Illness     Chief complaint: Flank Pain    HPI/ROS given by: Patient    Triage note: history fo stones, has had left kidney removed in [place] 5 years ago. Thinks he now has a kidney stone on the right. is here visiting fmaily from [place] and is returning nect week.    Christopher Peters is a 61 y.o. male with history of urolithiasis, history of left-sided nephrectomy 5 years ago, who presents with acute right flank pain.  Reports intermittent right flank pain x2 weeks, reminiscent of prior episodes of renal colic.  He was seen for this in [facility] 2 weeks ago where he had a CT abdomen pelvis without contrast that showed no acute process, prior left nephrectomy.    While here in the ED after my initial evaluation, he began experiencing copious purulent drainage from the posterior neck where there appears to be a spontaneously draining abscess/cyst.     Review of Systems     Review of systems negative except as per HPI     Allergies & Medications     Pt is allergic to tylenol with codeine #3 [acetaminophen-codeine].    Current/Home Medications    ATENOLOL (TENORMIN) 50 MG TABLET    Take 50 mg by mouth daily    HYDROMORPHONE (DILAUDID) 2 MG TABLET    Take 1 tablet (2 mg total) by mouth every 6 (six) hours as needed for Pain    ONDANSETRON (ZOFRAN ODT) 4 MG DISINTEGRATING TABLET    Take 1 tablet (4 mg total) by mouth every 8 (eight) hours as needed for Nausea    ONDANSETRON (ZOFRAN ODT) 4 MG DISINTEGRATING TABLET    Take 1 tablet (4 mg total) by mouth every 8  (eight) hours as needed for Nausea    OXYCODONE-ACETAMINOPHEN (PERCOCET) 5-325 MG PER TABLET    Take 1 tablet by mouth every 4 (four) hours as needed for Pain Do not drive or use machinery for 8 hours after taking!    TAMSULOSIN (FLOMAX) 0.4 MG CAP    Take 1 capsule (0.4 mg total) by mouth daily       Past Medical History     Pt has a past medical history of Hypertension and Kidney stones.     Past Surgical History     Pt  has a past surgical history that includes Nephrectomy radical (Left).     Family History     The family history is not on file.     Social History     Pt reports that he has been smoking cigarettes. He has been smoking an average of .5 packs per day. He has quit using smokeless tobacco.  His smokeless tobacco use included chew. He reports current alcohol use. He reports that he does not use drugs.     Physical Exam     Blood pressure 163/88, pulse 71, temperature 97.2 F (36.2 C), temperature source Temporal, resp. rate 22, height 1.753 m, weight 131 kg, SpO2 93 %.  Constitutional: Well developed, well nourished, active, in no apparent distress.  HENT:   Head: Normocephalic, atraumatic  Ears: No external lesions.  Nose: No external lesions. No epistaxis or drainage.  Eyes: PERRL. No scleral icterus. No conjunctival injection. EOMI.  Neck: Trachea is midline. No JVD. Normal range of motion. No apparent masses.  Spontaneously draining abscess/cyst to posterior neck.  Cardiovascular: Regular rhythm, S1 normal and S2 normal. No murmur heard.  Pulmonary/Chest: Effort normal. Lungs clear to auscultation bilaterally.   Abdominal: Soft, non-tender, non-distended. No masses.  No CVA tenderness.  Genitourinay/Anorectal: Defferred  Musculoskeletal: Normal range of motion of extremities. No deformity or apparent injury.   Neurological: Pt is alert. Cranial nerves are grossly intact. Moving all extremities without apparent deficit.   Psychiatric: Affect is appropriate. There is no agitation.   Skin: Skin is  warm, dry, well perfused. No rash noted.      Diagnostic Results     The results of the diagnostic studies below have been reviewed by myself:    Labs  Results       Procedure Component Value Units Date/Time    Urinalysis w Microscopic and Culture if Indicated [[id number]]  (Abnormal) Collected: 01/02/22 1117    Specimen: Urine, Random Updated: 01/02/22 1206     Color, UA Amber - Color may affect some analytes.     Clarity, UA Clear     Urine Specific Gravity 1.021     pH, Urine 5.0 pH      Protein, UR Negative mg/dL      Glucose, UA Negative mg/dL      Ketones UA Negative mg/dL      Bilirubin, UA Negative     Blood, UA Negative     Nitrite, UA Negative     Urobilinogen, UA 4.0 mg/dL      Leukocyte Esterase, UA Negative Leu/uL      UR Micro Performed     WBC, UA 1 /hpf     Comprehensive metabolic panel [[id number]]  (Abnormal) Collected: 01/02/22 0826    Specimen: Plasma Updated: 01/02/22 0901     Sodium 140 mMol/L      Potassium 3.9 mMol/L      Chloride 107 mMol/L      CO2 21 mMol/L      Calcium 9.2 mg/dL      Glucose 176 mg/dL      Creatinine 1.14 mg/dL      BUN 13 mg/dL      Protein, Total 7.4 gm/dL      Albumin 3.8 gm/dL      Alkaline Phosphatase 69 U/L      ALT 20 U/L      AST (SGOT) 19 U/L      Bilirubin, Total 1.2 mg/dL      Albumin/Globulin Ratio 1.06 Ratio      Anion Gap 15.9 mMol/L      BUN / Creatinine Ratio 11.4 Ratio      EGFR 74 mL/min/1.37m      Osmolality Calculated 284 mOsm/kg      Globulin 3.6 gm/dL     CBC and differential [[[id number]]Collected: 01/02/22 0826    Specimen: Blood Updated: 01/02/22 0859     WBC 6.3 K/cmm      RBC 4.84 M/cmm      Hemoglobin 15.5 gm/dL      Hematocrit 46.9 %      MCV 97 fL      MCH 32 pg      MCHC 33 gm/dL  RDW 12.0 %      PLT CT 131 K/cmm      MPV 8.9 fL      Neutrophils % 66.7 %      Lymphocytes 25.4 %      Monocytes 4.5 %      Eosinophils % 3.0 %      Basophils % 0.3 %      Neutrophils Absolute 4.2 K/cmm      Lymphocytes Absolute 1.6 K/cmm      Monocytes Absolute  0.3 K/cmm      Eosinophils Absolute 0.2 K/cmm      Basophils Absolute 0.0 K/cmm     Collect Blood [[id number]] Collected: 01/02/22 0826    Specimen: Other Updated: 01/02/22 0829     Collect Blood Label Notification         Radiologic Studies  US Renal Kidney Bladder Complete    Result Date: 01/02/2022  IMPRESSION: Post left nephrectomy. No acute process identified in the right kidney which shows compensatory hypertrophy. Benign-appearing cyst in the superior pole. No hydronephrosis ReadingStation:[email or web address]     EKG:      ED Meds     ED Medication Orders (From admission, onward)      Start Ordered     Status Ordering Provider    01/02/22 1210 01/02/22 1209  ketorolac (TORADOL) injection 15 mg  Once in ED        Route: Intravenous  Ordered Dose: 15 mg      Acknowledged TERZIAN, BRIAN    01/02/22 1022 01/02/22 1021  diazePAM (VALIUM) tablet 5 mg  Once        Route: Oral  Ordered Dose: 5 mg      Last MAR action: Given TERZIAN, BRIAN    01/02/22 1014 01/02/22 1013    Once in ED        Route: Intravenous  Ordered Dose: 4 mg      Discontinued TERZIAN, BRIAN    01/02/22 1014 01/02/22 1013  sodium chloride 0.9 % bolus 1,000 mL  Once in ED        Route: Intravenous  Ordered Dose: 1,000 mL      Last MAR action: New Bag TERZIAN, BRIAN    01/02/22 1014 01/02/22 1013  ondansetron (ZOFRAN) injection 4 mg  Once in ED        Route: Intravenous  Ordered Dose: 4 mg      Last MAR action: Given Melynda Keller           Medical Decision Making     DIFFERENTIAL DIAGNOSIS    Etiologies with significant risk of morbidity requiring escalation of care and/or posing threat to life or bodily function in the near term without treatment were considered including but not limited to:    Renal colic, pyelonephritis, musculoskeletal pain    CONSULTS    ED COURSE & MDM    Patient evaluated for acute right flank pain, patient concerned about the possibility of renal colic.  Recent ED visit notes from [facility] including CT scan were reviewed.   There is no evidence of urolithiasis at that point.  Labs, urinalysis, renal ultrasound from today's visit were reviewed, no evidence of hydronephrosis.  No evidence of UTI or hematuria.  Renal function within normal range.  Pain is likely musculoskeletal.  Recommend treatment with acetaminophen, and a prescription for tizanidine and Lidoderm patch was sent to his pharmacy.  Incidentally, patient experienced spontaneous drainage of a posterior neck soft tissue abscess or possible sebaceous  cyst while here.  It seems to be completely drained and not in need of additional I&D at this point.  He will be put on Augmentin for this and wound culture will be sent    In addition to the above history, please see nursing notes. Allergies, meds, past medical, family, social hx, and the results of the diagnostic studies performed have been reviewed by myself.    This chart was generated by an EMR and may contain errors or omissions not intended by the user.     Procedures / Critical Care     None     Diagnosis / Disposition     Clinical Impression  1. Acute right flank pain    2. Sebaceous cyst      Disposition  ED Disposition       ED Disposition   Discharge    Condition   --    Date/Time   Sun Jan 02, 2022 12:09 PM    Comment   [place] discharge to home/self care.    Condition at disposition: Stable            Follow up for Discharged Patients  No follow-up provider specified.    Prescriptions for Discharged Patients  New Prescriptions    AMOXICILLIN-CLAVULANATE (AUGMENTIN) 875-125 MG PER TABLET    Take 1 tablet by mouth 2 (two) times daily for 7 days    LIDOCAINE (LIDODERM) 5 %    Place 1 patch onto the skin every 24 hours Remove & Discard patch within 12 hours or as directed by MD    TIZANIDINE (ZANAFLEX) 2 MG TABLET    Take 1-2 tablets (2-4 mg) by mouth every 6 (six) hours as needed (pain/spasm)           Melynda Keller, [place]  01/02/22 1213      Sandie Ano, MD  01/03/22 1930    Electronically signed  by Sandie Ano, MD at 01/03/2022  7:30 PM EDT    Associated attestation - Sandie Ano, MD - 01/03/2022  7:30 PM EDT  Formatting of this note might be different from the original.  This patient was seen in the ED by the APC, Melynda Keller, PA-C.  I agree with the medical decision making.

## 2022-01-02 NOTE — ED Triage Notes (Signed)
Formatting of this note might be different from the original.  Pt states left flank pain for past couple weeks + nausea. Denies fever, vomiting, or diarrhea. Pt has hx of kidney stones and states "I think I have a kidney stone"  Electronically signed by Isabella Bowens, RN at 01/02/2022 10:13 AM EDT

## 2022-01-02 NOTE — ED Provider Notes (Signed)
[facility]  EMERGENCY DEPARTMENT  History and Physical Exam       Patient Name: David Oconnell, David Oconnell  Encounter Date:  01/02/2022  Physician Assistant: Boykin Peek, PA-C  Attending Physician: Skip Estimable, MD  PCP: Marisa Sprinkles, MD  Patient DOB:  05-27-1960  MRN:  [id number]  Room:  E52/E52-A    History of Presenting Illness     Chief complaint: Flank Pain    HPI/ROS given by: Patient    Triage note: history fo stones, has had left kidney removed in [place] 5 years ago. Thinks he now has a kidney stone on the right. is here visiting fmaily from [place] and is returning nect week.    David Oconnell is a 61 y.o. male with history of urolithiasis, history of left-sided nephrectomy 5 years ago, who presents with acute right flank pain.  Reports intermittent right flank pain x2 weeks, reminiscent of prior episodes of renal colic.  He was seen for this in [facility] 2 weeks ago where he had a CT abdomen pelvis without contrast that showed no acute process, prior left nephrectomy.    While here in the ED after my initial evaluation, he began experiencing copious purulent drainage from the posterior neck where there appears to be a spontaneously draining abscess/cyst.     Review of Systems     Review of systems negative except as per HPI     Allergies & Medications     Pt is allergic to tylenol with codeine #3 [acetaminophen-codeine].    Current/Home Medications    ATENOLOL (TENORMIN) 50 MG TABLET    Take 50 mg by mouth daily    HYDROMORPHONE (DILAUDID) 2 MG TABLET    Take 1 tablet (2 mg total) by mouth every 6 (six) hours as needed for Pain    ONDANSETRON (ZOFRAN ODT) 4 MG DISINTEGRATING TABLET    Take 1 tablet (4 mg total) by mouth every 8 (eight) hours as needed for Nausea    ONDANSETRON (ZOFRAN ODT) 4 MG DISINTEGRATING TABLET    Take 1 tablet (4 mg total) by mouth every 8 (eight) hours as needed for Nausea    OXYCODONE-ACETAMINOPHEN (PERCOCET) 5-325 MG PER TABLET    Take 1  tablet by mouth every 4 (four) hours as needed for Pain Do not drive or use machinery for 8 hours after taking!    TAMSULOSIN (FLOMAX) 0.4 MG CAP    Take 1 capsule (0.4 mg total) by mouth daily        Past Medical History     Pt has a past medical history of Hypertension and Kidney stones.     Past Surgical History     Pt  has a past surgical history that includes Nephrectomy radical (Left).     Family History     The family history is not on file.     Social History     Pt reports that he has been smoking cigarettes. He has been smoking an average of .5 packs per day. He has quit using smokeless tobacco.  His smokeless tobacco use included chew. He reports current alcohol use. He reports that he does not use drugs.     Physical Exam     Blood pressure 163/88, pulse 71, temperature 97.2 F (36.2 C), temperature source Temporal, resp. rate 22, height 1.753 m, weight 131 kg, SpO2 93 %.    Constitutional: Well developed, well nourished, active, in no apparent distress.  HENT:   Head: Normocephalic,  atraumatic  Ears: No external lesions.  Nose: No external lesions. No epistaxis or drainage.  Eyes: PERRL. No scleral icterus. No conjunctival injection. EOMI.  Neck: Trachea is midline. No JVD. Normal range of motion. No apparent masses.  Spontaneously draining abscess/cyst to posterior neck.  Cardiovascular: Regular rhythm, S1 normal and S2 normal. No murmur heard.  Pulmonary/Chest: Effort normal. Lungs clear to auscultation bilaterally.   Abdominal: Soft, non-tender, non-distended. No masses.  No CVA tenderness.  Genitourinay/Anorectal: Defferred  Musculoskeletal: Normal range of motion of extremities. No deformity or apparent injury.   Neurological: Pt is alert. Cranial nerves are grossly intact. Moving all extremities without apparent deficit.   Psychiatric: Affect is appropriate. There is no agitation.   Skin: Skin is warm, dry, well perfused. No rash noted.      Diagnostic Results     The results of the diagnostic  studies below have been reviewed by myself:    Labs  Results       Procedure Component Value Units Date/Time    Urinalysis w Microscopic and Culture if Indicated [[id number]]  (Abnormal) Collected: 01/02/22 1117    Specimen: Urine, Random Updated: 01/02/22 1206     Color, UA Amber - Color may affect some analytes.     Clarity, UA Clear     Urine Specific Gravity 1.021     pH, Urine 5.0 pH      Protein, UR Negative mg/dL      Glucose, UA Negative mg/dL      Ketones UA Negative mg/dL      Bilirubin, UA Negative     Blood, UA Negative     Nitrite, UA Negative     Urobilinogen, UA 4.0 mg/dL      Leukocyte Esterase, UA Negative Leu/uL      UR Micro Performed     WBC, UA 1 /hpf     Comprehensive metabolic panel [[id number]]  (Abnormal) Collected: 01/02/22 0826    Specimen: Plasma Updated: 01/02/22 0901     Sodium 140 mMol/L      Potassium 3.9 mMol/L      Chloride 107 mMol/L      CO2 21 mMol/L      Calcium 9.2 mg/dL      Glucose 621 mg/dL      Creatinine 3.08 mg/dL      BUN 13 mg/dL      Protein, Total 7.4 gm/dL      Albumin 3.8 gm/dL      Alkaline Phosphatase 69 U/L      ALT 20 U/L      AST (SGOT) 19 U/L      Bilirubin, Total 1.2 mg/dL      Albumin/Globulin Ratio 1.06 Ratio      Anion Gap 15.9 mMol/L      BUN / Creatinine Ratio 11.4 Ratio      EGFR 74 mL/min/1.67m2      Osmolality Calculated 284 mOsm/kg      Globulin 3.6 gm/dL     CBC and differential [[id number]] Collected: 01/02/22 0826    Specimen: Blood Updated: 01/02/22 0859     WBC 6.3 K/cmm      RBC 4.84 M/cmm      Hemoglobin 15.5 gm/dL      Hematocrit 95.2 %      MCV 97 fL      MCH 32 pg      MCHC 33 gm/dL      RDW 84.1 %      PLT CT 131 K/cmm  MPV 8.9 fL      Neutrophils % 66.7 %      Lymphocytes 25.4 %      Monocytes 4.5 %      Eosinophils % 3.0 %      Basophils % 0.3 %      Neutrophils Absolute 4.2 K/cmm      Lymphocytes Absolute 1.6 K/cmm      Monocytes Absolute 0.3 K/cmm      Eosinophils Absolute 0.2 K/cmm      Basophils Absolute 0.0 K/cmm     Collect Blood  [[id number]] Collected: 01/02/22 0826    Specimen: Other Updated: 01/02/22 0829     Collect Blood Label Notification            Radiologic Studies  US Renal Kidney Bladder Complete    Result Date: 01/02/2022  IMPRESSION: Post left nephrectomy. No acute process identified in the right kidney which shows compensatory hypertrophy. Benign-appearing cyst in the superior pole. No hydronephrosis ReadingStation:[email or web address]     EKG:      ED Meds     ED Medication Orders (From admission, onward)      Start Ordered     Status Ordering Provider    01/02/22 1210 01/02/22 1209  ketorolac (TORADOL) injection 15 mg  Once in ED        Route: Intravenous  Ordered Dose: 15 mg       Acknowledged Takara Sermons    01/02/22 1022 01/02/22 1021  diazePAM (VALIUM) tablet 5 mg  Once        Route: Oral  Ordered Dose: 5 mg       Last MAR action: Given Jamika Sadek    01/02/22 1014 01/02/22 1013    Once in ED        Route: Intravenous  Ordered Dose: 4 mg       Discontinued Senie Lanese    01/02/22 1014 01/02/22 1013  sodium chloride 0.9 % bolus 1,000 mL  Once in ED        Route: Intravenous  Ordered Dose: 1,000 mL       Last MAR action: New Bag Tuwana Kapaun    01/02/22 1014 01/02/22 1013  ondansetron (ZOFRAN) injection 4 mg  Once in ED        Route: Intravenous  Ordered Dose: 4 mg       Last MAR action: Given Boykin Peek             Medical Decision Making     DIFFERENTIAL DIAGNOSIS    Etiologies with significant risk of morbidity requiring escalation of care and/or posing threat to life or bodily function in the near term without treatment were considered including but not limited to:    Renal colic, pyelonephritis, musculoskeletal pain    CONSULTS      ED COURSE & MDM    Patient evaluated for acute right flank pain, patient concerned about the possibility of renal colic.  Recent ED visit notes from [facility] hospital including CT scan were reviewed.  There is no evidence of urolithiasis at that point.  Labs, urinalysis, renal ultrasound  from today's visit were reviewed, no evidence of hydronephrosis.  No evidence of UTI or hematuria.  Renal function within normal range.  Pain is likely musculoskeletal.  Recommend treatment with acetaminophen, and a prescription for tizanidine and Lidoderm patch was sent to his pharmacy.  Incidentally, patient experienced spontaneous drainage of a posterior neck soft tissue abscess or possible sebaceous cyst while here.  It  seems to be completely drained and not in need of additional I&D at this point.  He will be put on Augmentin for this and wound culture will be sent    In addition to the above history, please see nursing notes. Allergies, meds, past medical, family, social hx, and the results of the diagnostic studies performed have been reviewed by myself.    This chart was generated by an EMR and may contain errors or omissions not intended by the user.     Procedures / Critical Care     None     Diagnosis / Disposition     Clinical Impression  1. Acute right flank pain    2. Sebaceous cyst        Disposition  ED Disposition       ED Disposition   Discharge    Condition   --    Date/Time   Sun Jan 02, 2022 12:09 PM    Comment   [facility]    Condition at disposition: Stable                 Follow up for Discharged Patients  No follow-up provider specified.    Prescriptions for Discharged Patients  New Prescriptions    AMOXICILLIN-CLAVULANATE (AUGMENTIN) 875-125 MG PER TABLET    Take 1 tablet by mouth 2 (two) times daily for 7 days    LIDOCAINE (LIDODERM) 5 %    Place 1 patch onto the skin every 24 hours Remove & Discard patch within 12 hours or as directed by MD    TIZANIDINE (ZANAFLEX) 2 MG TABLET    Take 1-2 tablets (2-4 mg) by mouth every 6 (six) hours as needed (pain/spasm)                 Boykin Peekerzian, Bricyn Labrada, PA  01/02/22 1213       Skip EstimableShaw, Christopher R, MD  01/03/22 1930

## 2022-01-02 NOTE — ED Triage Notes (Signed)
Pt states left flank pain for past couple weeks + nausea. Denies fever, vomiting, or diarrhea. Pt has hx of kidney stones and states "I think I have a kidney stone"

## 2022-01-03 LAB — VH CULTURE AND SMEAR, WOUND

## 2022-01-04 LAB — VH CULTURE AND SMEAR, WOUND

## 2022-01-25 ENCOUNTER — Emergency Department
Admission: EM | Admit: 2022-01-25 | Discharge: 2022-01-25 | Disposition: A | Discharge status: Home / Self Care | Payer: Medicaid Other | Attending: Emergency Medicine | Admitting: Emergency Medicine

## 2022-01-25 ENCOUNTER — Other Ambulatory Visit: Payer: Self-pay

## 2022-01-25 DIAGNOSIS — L02211 Cutaneous abscess of abdominal wall: Secondary | ICD-10-CM

## 2022-01-25 DIAGNOSIS — L0291 Cutaneous abscess, unspecified: Secondary | ICD-10-CM

## 2022-01-25 MED ORDER — DOXYCYCLINE HYCLATE 100 MG CAPSULE
100.0000 mg | ORAL_CAPSULE | Freq: Two times a day (BID) | ORAL | 0 refills | Status: AC
Start: 2022-01-25 — End: 2022-02-04

## 2022-01-25 NOTE — ED Triage Notes (Signed)
Formatting of this note might be different from the original.  Patient reports red bump on LLQ abdominal wall for 2 days, redness spreading today.   Electronically signed by Barnet Glasgow, RN at 01/25/2022  7:02 PM EDT

## 2022-01-25 NOTE — ED Provider Notes (Signed)
Formatting of this note is different from the original.       Dr. Si Gaul. [facility]          Emergency Department Visit Note    Date of Service: 01/25/2022  Primary Care Doctor:Pcp Not In System  Patient information was obtained from patient.  History/Exam limitations: none.  Patient presented to the Emergency Department by private vehicle.    Chief Complaint:  Skin problem    HPI: The patient is a(an) 61 y.o. male.     Two days ago noticed a pimple on his left lower abdomen.  Now having increased redness spreading.  No other symptoms.  Feels normal otherwise.    Review of Systems:    The pertinent positive and negative symptoms are as per HPI. All other systems reviewed and are negative.    Past Medical History:  No past medical history on file.    Past Surgical History:  none    Family History:  Family Medical History:    None          Social History:    Social History     Substance and Sexual Activity   Drug Use Not on file     Current Outpatient Medications:   Outpatient Medications Marked as Taking for the 01/25/22 encounter [facility] Encounter)   Medication Sig    doxycycline hyclate (VIBRAMYCIN) 100 mg Oral Capsule Take 1 Capsule (100 mg total) by mouth Twice daily for 10 days     Allergies:   No Known Allergies    Physical Exam     Vital Signs:  Filed Vitals:    01/25/22 1902   BP: (!) 150/66   Pulse: 64   Resp: 19   Temp: 36.8 C (98.3 F)   SpO2: 95%     The initial visit vital signs are reviewed as above.    Physical Exam:   General: No apparent acute distress. Very pleasant.   Eyes: Conjunctiva are clear. Pupils are equal, round  HENT: Mucous membranes are moist. Nares are clear.   Neck: Supple. No meningeal signs.  Lungs: Clear to auscultation bilaterally. Good air movement.   Cardiovascular: Normal rate and regular rhythm. No murmurs, rubs or gallops.  Abdomen: Soft. Non-tender. No rebound, guarding, or peritoneal signs.   Extremities: Atraumatic. No cyanosis. No significant  peripheral edema.  Skin:  Small fluctuant area left lower abdomen with surrounding erythema  Neurologic: Strength and sensation grossly normal throughout.  Psychiatric: Alert and oriented. Affect within normal limits.    ED Course            Summary/ Plan:   Orders Placed This Encounter    doxycycline hyclate (VIBRAMYCIN) 100 mg Oral Capsule     Procedure note incision and drainage of abscess   Verbal consent was obtained.  Area prepped with Betadine anesthetized with 1% lidocaine with epi.  Eleven blade was then used to cut into the fluctuant area.  Small amount of purulent material expressed.  Then explored for loculations with hemostats and irrigated with normal saline    Medical Decision Making  Problems Addressed:  Abscess: acute illness or injury    Risk  Prescription drug management.    Clinical Impression   Abscess (Primary)     Disposistion  1) Discharge home : Stable  2) Return to ED with worsening conditions or any new concerns  3) Strict return precautions given to patient  4) Patient understood  and agrees with the plan    Filed Vitals:    01/25/22 1902   BP: (!) 150/66   Pulse: 64   Resp: 19   Temp: 36.8 C (98.3 F)   SpO2: 95%     Prescriptions:  Current Discharge Medication List       START taking these medications    Details   doxycycline hyclate (VIBRAMYCIN) 100 mg Oral Capsule Take 1 Capsule (100 mg total) by mouth Twice daily for 10 days  Qty: 20 Capsule, Refills: 0         Follow up:  [facility] - Emergency Department  [address]  [place] [address]  [phone]  Go in 1 week  As needed, If symptoms worsen, For wound re-check    Fatima Blank, MD 01/25/2022, 19:45        Portions of this note may be dictated using voice recognition software or a dictation service. Variances in spelling and vocabulary are possible and unintentional. Not all errors are caught/corrected. Please notify the [facility] if any discrepancies are noted or if the meaning of any  statement is not clear.       Electronically signed by Fatima Blank, MD at 01/25/2022  7:47 PM EDT

## 2022-01-25 NOTE — Unmapped (Signed)
Formatting of this note is different from the original.  ..Patient discharged home with family.  AVS reviewed with patient/care giver.  A written copy of the AVS and discharge instructions was given to the patient/care giver.  Questions sufficiently answered as needed.  Patient/care giver encouraged to follow up with PCP as indicated.  In the event of an emergency, patient/care giver instructed to call 911 or go to the nearest emergency room.     .    Current Discharge Medication List       START taking these medications.        Details   doxycycline hyclate 100 mg Capsule  Commonly known as: VIBRAMYCIN   100 mg, Oral, 2 TIMES DAILY  Qty: 20 Capsule  Refills: 0          CONTINUE these medications - NO CHANGES were made during your visit.        Details   acetaminophen 500 mg Tablet  Commonly known as: TYLENOL   1,000 mg, Oral, EVERY 8 HOURS PRN  Refills: 0            Electronically signed by Debbora Dus, [place] Neighbor, LPN at 57/05/7791  7:50 PM EDT

## 2022-01-25 NOTE — ED Provider Notes (Signed)
Dr. Si Gaul. [facility]          Emergency Department Visit Note    Date of Service: 01/25/2022  Primary Care Doctor:Pcp Not In System  Patient information was obtained from patient.  History/Exam limitations: none.  Patient presented to the Emergency Department by private vehicle.    Chief Complaint:  Skin problem    HPI: The patient is a(an) 61 y.o. male.       Two days ago noticed a pimple on his left lower abdomen.  Now having increased redness spreading.  No other symptoms.  Feels normal otherwise.    Review of Systems:    The pertinent positive and negative symptoms are as per HPI. All other systems reviewed and are negative.      Past Medical History:  No past medical history on file.    Past Surgical History:  none    Family History:  Family Medical History:    None            Social History:     Social History     Substance and Sexual Activity   Drug Use Not on file       Current Outpatient Medications:   Outpatient Medications Marked as Taking for the 01/25/22 encounter [facility] Encounter)   Medication Sig    doxycycline hyclate (VIBRAMYCIN) 100 mg Oral Capsule Take 1 Capsule (100 mg total) by mouth Twice daily for 10 days       Allergies:   No Known Allergies    Physical Exam     Vital Signs:  Filed Vitals:    01/25/22 1902   BP: (!) 150/66   Pulse: 64   Resp: 19   Temp: 36.8 C (98.3 F)   SpO2: 95%       The initial visit vital signs are reviewed as above.      Physical Exam:   General: No apparent acute distress. Very pleasant.   Eyes: Conjunctiva are clear. Pupils are equal, round  HENT: Mucous membranes are moist. Nares are clear.   Neck: Supple. No meningeal signs.  Lungs: Clear to auscultation bilaterally. Good air movement.   Cardiovascular: Normal rate and regular rhythm. No murmurs, rubs or gallops.  Abdomen: Soft. Non-tender. No rebound, guarding, or peritoneal signs.   Extremities: Atraumatic. No cyanosis. No significant peripheral edema.  Skin:  Small fluctuant area left  lower abdomen with surrounding erythema  Neurologic: Strength and sensation grossly normal throughout.  Psychiatric: Alert and oriented. Affect within normal limits.        ED Course                Summary/ Plan:   Orders Placed This Encounter    doxycycline hyclate (VIBRAMYCIN) 100 mg Oral Capsule     Procedure note incision and drainage of abscess   Verbal consent was obtained.  Area prepped with Betadine anesthetized with 1% lidocaine with epi.  Eleven blade was then used to cut into the fluctuant area.  Small amount of purulent material expressed.  Then explored for loculations with hemostats and irrigated with normal saline    Medical Decision Making  Problems Addressed:  Abscess: acute illness or injury    Risk  Prescription drug management.        Clinical Impression   Abscess (Primary)       Disposistion  1) Discharge home : Stable  2) Return to ED with worsening conditions or any  new concerns  3) Strict return precautions given to patient  4) Patient understood and agrees with the plan    Filed Vitals:    01/25/22 1902   BP: (!) 150/66   Pulse: 64   Resp: 19   Temp: 36.8 C (98.3 F)   SpO2: 95%         Prescriptions:  Current Discharge Medication List        START taking these medications    Details   doxycycline hyclate (VIBRAMYCIN) 100 mg Oral Capsule Take 1 Capsule (100 mg total) by mouth Twice daily for 10 days  Qty: 20 Capsule, Refills: 0             Follow up:  [facility] - Emergency Department  [facility]  [place] [address]  [phone]  Go in 1 week  As needed, If symptoms worsen, For wound re-check      Fatima Blank, MD 01/25/2022, 19:45         Portions of this note may be dictated using voice recognition software or a dictation service. Variances in spelling and vocabulary are possible and unintentional. Not all errors are caught/corrected. Please notify the [facility] Parkin if any discrepancies are noted or if the meaning of any statement is not clear.

## 2022-01-25 NOTE — ED Triage Notes (Signed)
Patient reports red bump on LLQ abdominal wall for 2 days, redness spreading today.

## 2022-01-25 NOTE — ED Nurses Note (Signed)
..  Patient discharged home with family.  AVS reviewed with patient/care giver.  A written copy of the AVS and discharge instructions was given to the patient/care giver.  Questions sufficiently answered as needed.  Patient/care giver encouraged to follow up with PCP as indicated.  In the event of an emergency, patient/care giver instructed to call 911 or go to the nearest emergency room.       .     Current Discharge Medication List        START taking these medications.        Details   doxycycline hyclate 100 mg Capsule  Commonly known as: VIBRAMYCIN   100 mg, Oral, 2 TIMES DAILY  Qty: 20 Capsule  Refills: 0            CONTINUE these medications - NO CHANGES were made during your visit.        Details   acetaminophen 500 mg Tablet  Commonly known as: TYLENOL   1,000 mg, Oral, EVERY 8 HOURS PRN  Refills: 0

## 2022-02-02 DIAGNOSIS — J45909 Unspecified asthma, uncomplicated: Secondary | ICD-10-CM

## 2022-02-02 NOTE — ED Provider Notes (Signed)
[facility] EMERGENCY DEPT  EMERGENCY DEPARTMENT HISTORY AND PHYSICAL EXAM      Date: 02/03/2022  Patient Name: Christopher Peters  MRN: [id number]  [place] 07-Feb-1961  Date of evaluation: 02/02/2022  Provider: Dwyane Luo, MD   Note Started: 1:08 AM EDT 02/03/22    HISTORY OF PRESENT ILLNESS     Chief Complaint   Patient presents with    Shortness of Breath       History Provided By: Patient    HPI: Christopher Peters is a 61 y.o. male with past medical history of HTN and nephrolithiasis who comes to the ED with shortness of breath.  He reports been having runny nose, nasal congestion and sore throat for several days.  Patient reports that he has been having productive cough of yellow sputum.  He has been having intermittent shortness of breath associate with his coughing.  This has been going on for a few days.  There is no additional relieving factors without associated changes in bowel, dater, urinary habits.  Denying any chest pain, abdominal pain, nausea or vomiting.  He reports history of chronic smoking.  Attempting to quit.  No prior history of reactive airway disease or CAD.    PAST MEDICAL HISTORY   Past Medical History:  Past Medical History:   Diagnosis Date    Hypertension     Hypertension     Kidney stone     Kidney stones        Past Surgical History:  Past Surgical History:   Procedure Laterality Date    FRACTURE SURGERY      KIDNEY REMOVAL Left        Family History:  History reviewed. No pertinent family history.    Social History:  Social History     Tobacco Use    Smoking status: Every Day    Smokeless tobacco: Never   Substance Use Topics    Alcohol use: Not Currently    Drug use: Never       Allergies:  Allergies   Allergen Reactions    Codeine        PCP: No primary care provider on file.    Current Meds:   Current Facility-Administered Medications   Medication Dose Route Frequency Provider Last Rate Last Admin    nicotine (NICODERM CQ) 14 MG/24HR 1 patch  1 patch TransDERmal Once Kenedie Dirocco I Moreen Fowler, MD   1 patch at 02/03/22 0430     Current Outpatient Medications   Medication Sig Dispense Refill    albuterol sulfate HFA (VENTOLIN HFA) 108 (90 Base) MCG/ACT inhaler Inhale 2 puffs into the lungs 4 times daily as needed for Wheezing 18 g 0    nicotine (NICODERM CQ) 14 MG/24HR Place 1 patch onto the skin daily for 14 days 14 patch 0    atenolol (TENORMIN) 50 MG tablet Take 50 mg by mouth daily      Cholecalciferol (VITAMIN D3) 2000 units CAPS Take by mouth      promethazine (PHENERGAN) 25 MG tablet Take 1 tablet by mouth every 6 hours as needed for Nausea 15 tablet 0       Social Determinants of Health:   Social Determinants of Health     Tobacco Use: High Risk (02/03/2022)    Patient History     Smoking Tobacco Use: Every Day     Smokeless Tobacco Use: Never     Passive Exposure: Not on file   Alcohol Use: Not on  Health visitor Strain: Not on file   Food Insecurity: Not on file   Transportation Needs: Not on file   Physical Activity: Not on file   Stress: Not on file   Social Connections: Not on file   Intimate Partner Violence: Not on file   Depression: Not on file   Housing Stability: Not on file       PHYSICAL EXAM   Physical Exam  Vitals and nursing note reviewed.   Constitutional:       General: He is not in acute distress.     Appearance: He is well-developed. He is obese. He is not ill-appearing, toxic-appearing or diaphoretic.   HENT:      Head: Normocephalic and atraumatic.   Eyes:      Extraocular Movements: Extraocular movements intact.      Conjunctiva/sclera: Conjunctivae normal.   Cardiovascular:      Rate and Rhythm: Normal rate and regular rhythm.   Pulmonary:      Comments: Speaking in full sentences, intermittently coughing, scattered wheezing noted, no tachypnea or respiratory distress  Abdominal:      Palpations: Abdomen is soft.      Tenderness: There is no abdominal tenderness.   Musculoskeletal:      Right lower leg: No edema.      Left lower leg: No edema.   Neurological:       Mental Status: He is alert and oriented to person, place, and time.         SCREENINGS               LAB, EKG AND DIAGNOSTIC RESULTS   Labs:  Recent Results (from the past 12 hour(s))   CBC with Auto Differential    Collection Time: 02/03/22 12:31 AM   Result Value Ref Range    WBC 7.0 4.1 - 11.1 K/uL    RBC 4.56 4.10 - 5.70 M/uL    Hemoglobin 14.4 12.1 - 17.0 g/dL    Hematocrit 41.0 36.6 - 50.3 %    MCV 89.9 80.0 - 99.0 FL    MCH 31.6 26.0 - 34.0 PG    MCHC 35.1 30.0 - 36.5 g/dL    RDW 12.6 11.5 - 14.5 %    Platelets 126 (L) 150 - 400 K/uL    MPV 11.1 8.9 - 12.9 FL    Nucleated RBCs 0.0 0.0 PER 100 WBC    nRBC 0.00 0.00 - 0.01 K/uL    Neutrophils % 60 32 - 75 %    Lymphocytes % 30 12 - 49 %    Monocytes % 7 5 - 13 %    Eosinophils % 3 0 - 7 %    Basophils % 0 0 - 1 %    Immature Granulocytes 0 0 - 0.5 %    Neutrophils Absolute 4.1 1.8 - 8.0 K/UL    Lymphocytes Absolute 2.1 0.8 - 3.5 K/UL    Monocytes Absolute 0.5 0.0 - 1.0 K/UL    Eosinophils Absolute 0.2 0.0 - 0.4 K/UL    Basophils Absolute 0.0 0.0 - 0.1 K/UL    Absolute Immature Granulocyte 0.0 0.00 - 0.04 K/UL    Differential Type AUTOMATED     Comprehensive Metabolic Panel    Collection Time: 02/03/22 12:31 AM   Result Value Ref Range    Sodium 139 136 - 145 mmol/L    Potassium 3.5 3.5 - 5.1 mmol/L    Chloride 108 97 - 108 mmol/L  CO2 26 21 - 32 mmol/L    Anion Gap 5 5 - 15 mmol/L    Glucose 117 (H) 65 - 100 mg/dL    BUN 16 6 - 20 mg/dL    Creatinine 1.29 0.70 - 1.30 mg/dL    Bun/Cre Ratio 12 12 - 20      Est, Glom Filt Rate >60 >60 ml/min/1.33m    Calcium 8.8 8.5 - 10.1 mg/dL    Total Bilirubin 1.4 (H) 0.2 - 1.0 mg/dL    AST 21 15 - 37 U/L    ALT 29 12 - 78 U/L    Alk Phosphatase 63 45 - 117 U/L    Total Protein 7.1 6.4 - 8.2 g/dL    Albumin 3.7 3.5 - 5.0 g/dL    Globulin 3.4 2.0 - 4.0 g/dL    Albumin/Globulin Ratio 1.1 1.1 - 2.2     Magnesium    Collection Time: 02/03/22 12:31 AM   Result Value Ref Range    Magnesium 2.0 1.6 - 2.4 mg/dL   Brain  Natriuretic Peptide    Collection Time: 02/03/22 12:31 AM   Result Value Ref Range    NT Pro-BNP 214 (H) <125 pg/mL   COVID-19, Rapid    Collection Time: 02/03/22  1:57 AM    Specimen: Nasopharyngeal   Result Value Ref Range    SARS-CoV-2, Rapid Not Detected Not Detected         Radiologic Studies:  Non-plain film images such as CT, Ultrasound and MRI are read by the radiologist. Plain radiographic images are visualized and preliminarily interpreted by the ED Provider with the following findings: See ED Course Below    Interpretation per the Radiologist below, if available at the time of this note:  XR CHEST PORTABLE   Final Result   Mild diffuse pulmonary edema.   Cardiomegaly.               EMERGENCY DEPARTMENT COURSE and DIFFERENTIAL DIAGNOSIS/MDM   CC/HPI Summary, DDx, ED Course, and Reassessment:     Records Reviewed (source and summary of external notes): Prior medical records and Nursing notes    Vitals:    Vitals:    02/03/22 0003 02/03/22 0034 02/03/22 0428   BP: (!) 163/79  (!) 184/94   Pulse: 63  64   Resp: 22  18   Temp: 98.5 F (36.9 C)     TempSrc: Oral     SpO2: 98% 98% 100%   Weight: 127 kg (280 lb)     Height: 1.753 m (5' 9" )       Patient is a 61year old male who comes to the ED with shortness of breath and coughing.  He he reported that he has some congestion.  Examination revealed scattered wheezing.  No rales or rhonchi appreciated.  Presentation appears to be consistent with reactive airway disease specially in the setting of chronic tobacco smoking.  Not consistent with congestive heart failure or pulmonary edema.  Also, not consistent with acute infectious process such as pneumonia.  We will get labs including CBC, chemistry, chest x-ray and will give a trial of bronchodilators and reassess    ED COURSE    Events reviewed interpreted patient EKG Pete EKG was done at 12:10 AM and my dictation is NSR rate of 62, intervals within normal, left axis deviation, no specific ST elevation or  depression meeting criteria, no signs of dysrhythmia, bifascicular block noted.    Independently reviewed interpreted patient work-up.  CBC without  leukocytosis, anemia, or significant thrombocytopenia.  Chemistry without electrolyte derangement or acute kidney injury.  No transaminitis.  COVID test is negative.  I independently reviewed chest x-ray.  Did not appreciate infiltrates or significant pneumonia or significant pulmonary edema.  Final radiology read reports mild pulmonary edema.  However, clinically patient presentation does not appear to be consistent with pulmonary edema.  I did order proBNP which came back unremarkable.  On reassessment, patient symptoms subsided after administration of ipratropium.  Will discharge with bronchodilators and referral to follow-up with primary care.    Patient was given the following medications:  Medications   nicotine (NICODERM CQ) 14 MG/24HR 1 patch (1 patch TransDERmal Patch Applied 02/03/22 0430)   ipratropium 0.5 mg-albuterol 2.5 mg (DUONEB) nebulizer solution 1 Dose (1 Dose Inhalation Given 02/03/22 0047)   dexamethasone (DECADRON) tablet 12 mg (12 mg Oral Given 02/03/22 0429)     After completion of workup and discussion of results and diagnoses with the patient, all the patient's questions were answered. If required, all follow up appointments and treatments were discussed and explained. The patient sounded understanding and agrees with the plan.    The patient understands that at this time there is no evidence for a more malignant underlying process, but the patient also understands that early in the process of an illness, an emergency department workup can be falsely reassuring. Routine discharge counseling was given to the patient and the patient understands that worsening, changing or persistent symptoms should prompt an immediate call or follow up with their primary physician or the emergency department. The importance of appropriate follow up was also discussed  with the patient. More extensive discharge instructions were given in the patient's discharge paperwork.    Advised patient to quit and offered support.  Recommended nicotine replacement with patches, reviewed use and dosing.    CRITICAL CARE TIME   Patient does not meet Critical Care Time, 0 minutes    ED FINAL IMPRESSION     1. Reactive airway disease without complication, unspecified asthma severity, unspecified whether persistent    2. Tobacco dependence          DISPOSITION/PLAN   DISPOSITION Decision To Discharge 02/03/2022 03:56:43 AM    Discharge Note: The patient is stable for discharge home. The signs, symptoms, diagnosis, and discharge instructions have been discussed, understanding conveyed, and agreed upon. The patient is to follow up as recommended or return to ER should their symptoms worsen.      PATIENT REFERRED TO:  [facility] EMERGENCY DEPT  [place]  [phone]  Go to   As needed, If symptoms worsen    PCP    Schedule an appointment as soon as possible for a visit in 3 days  For reevaluation, For follow-up        DISCHARGE MEDICATIONS:     Medication List        START taking these medications      albuterol sulfate HFA 108 (90 Base) MCG/ACT inhaler  Commonly known as: Ventolin HFA  Inhale 2 puffs into the lungs 4 times daily as needed for Wheezing     nicotine 14 MG/24HR  Commonly known as: NICODERM CQ  Place 1 patch onto the skin daily for 14 days            ASK your doctor about these medications      atenolol 50 MG tablet  Commonly known as: TENORMIN     promethazine 25 MG  tablet  Commonly known as: PHENERGAN  Take 1 tablet by mouth every 6 hours as needed for Nausea     Vitamin D3 50 MCG (2000 UT) Caps               Where to Get Your Medications        These medications were sent to [id number] [place] [phone] [place] [address]     Phone: [phone]  albuterol  sulfate HFA 108 (90 Base) MCG/ACT inhaler  nicotine 14 MG/24HR           DISCONTINUED MEDICATIONS:  Discharge Medication List as of 02/03/2022  4:27 AM          I am the Primary Clinician of Record. Regis Hinton IClayborn Heron MD (electronically signed)    (Please note that parts of this dictation were completed with voice recognition software. Quite often unanticipated grammatical, syntax, homophones, and other interpretive errors are inadvertently transcribed by the computer software. Please disregards these errors. Please excuse any errors that have escaped final proofreading.)     ADwyane Luo MD  02/03/22 [phone]

## 2022-02-03 ENCOUNTER — Emergency Department: Admit: 2022-02-03 | Payer: MEDICAID

## 2022-02-03 ENCOUNTER — Inpatient Hospital Stay
Admit: 2022-02-03 | Discharge: 2022-02-03 | Disposition: A | Discharge status: Home / Self Care | Payer: MEDICAID | Attending: Emergency Medicine

## 2022-02-03 DIAGNOSIS — J45909 Unspecified asthma, uncomplicated: Secondary | ICD-10-CM

## 2022-02-03 LAB — CBC WITH AUTO DIFFERENTIAL
Absolute Immature Granulocyte: 0 10*3/uL (ref 0.00–0.04)
Basophils %: 0 % (ref 0–1)
Basophils Absolute: 0 10*3/uL (ref 0.0–0.1)
Eosinophils %: 3 % (ref 0–7)
Eosinophils Absolute: 0.2 10*3/uL (ref 0.0–0.4)
Hematocrit: 41 % (ref 36.6–50.3)
Hemoglobin: 14.4 g/dL (ref 12.1–17.0)
Immature Granulocytes: 0 % (ref 0–0.5)
Lymphocytes %: 30 % (ref 12–49)
Lymphocytes Absolute: 2.1 10*3/uL (ref 0.8–3.5)
MCH: 31.6 PG (ref 26.0–34.0)
MCHC: 35.1 g/dL (ref 30.0–36.5)
MCV: 89.9 FL (ref 80.0–99.0)
MPV: 11.1 FL (ref 8.9–12.9)
Monocytes %: 7 % (ref 5–13)
Monocytes Absolute: 0.5 10*3/uL (ref 0.0–1.0)
Neutrophils %: 60 % (ref 32–75)
Neutrophils Absolute: 4.1 10*3/uL (ref 1.8–8.0)
Nucleated RBCs: 0 PER 100 WBC
Platelets: 126 10*3/uL — ABNORMAL LOW (ref 150–400)
RBC: 4.56 M/uL (ref 4.10–5.70)
RDW: 12.6 % (ref 11.5–14.5)
WBC: 7 10*3/uL (ref 4.1–11.1)
nRBC: 0 10*3/uL (ref 0.00–0.01)

## 2022-02-03 LAB — COMPREHENSIVE METABOLIC PANEL
ALT: 29 U/L (ref 12–78)
AST: 21 U/L (ref 15–37)
Albumin/Globulin Ratio: 1.1 (ref 1.1–2.2)
Albumin: 3.7 g/dL (ref 3.5–5.0)
Alk Phosphatase: 63 U/L (ref 45–117)
Anion Gap: 5 mmol/L (ref 5–15)
BUN: 16 mg/dL (ref 6–20)
Bun/Cre Ratio: 12 (ref 12–20)
CO2: 26 mmol/L (ref 21–32)
Calcium: 8.8 mg/dL (ref 8.5–10.1)
Chloride: 108 mmol/L (ref 97–108)
Creatinine: 1.29 mg/dL (ref 0.70–1.30)
Est, Glom Filt Rate: >60 mL/min/{1.73_m2} (ref >60)
Globulin: 3.4 g/dL (ref 2.0–4.0)
Glucose: 117 mg/dL — ABNORMAL HIGH (ref 65–100)
Potassium: 3.5 mmol/L (ref 3.5–5.1)
Sodium: 139 mmol/L (ref 136–145)
Total Bilirubin: 1.4 mg/dL — ABNORMAL HIGH (ref 0.2–1.0)
Total Protein: 7.1 g/dL (ref 6.4–8.2)

## 2022-02-03 LAB — COVID-19, RAPID: SARS-CoV-2, Rapid: NOT DETECTED

## 2022-02-03 LAB — BRAIN NATRIURETIC PEPTIDE: NT Pro-BNP: 214 pg/mL — ABNORMAL HIGH (ref <125)

## 2022-02-03 LAB — MAGNESIUM: Magnesium: 2 mg/dL (ref 1.6–2.4)

## 2022-02-03 MED ORDER — NICOTINE 14 MG/24HR TD PT24
14 MG/24HR | Freq: Once | TRANSDERMAL | Status: DC
Start: 2022-02-03 — End: 2022-02-03
  Administered 2022-02-03: 09:00:00 1 via TRANSDERMAL

## 2022-02-03 MED ORDER — IPRATROPIUM-ALBUTEROL 0.5-2.5 (3) MG/3ML IN SOLN
RESPIRATORY_TRACT | Status: AC
Start: 2022-02-03 — End: 2022-02-03
  Administered 2022-02-03: 05:00:00 1 via RESPIRATORY_TRACT

## 2022-02-03 MED ORDER — ALBUTEROL SULFATE HFA 108 (90 BASE) MCG/ACT IN AERS
108 (90 Base) MCG/ACT | Freq: Four times a day (QID) | RESPIRATORY_TRACT | 0 refills | Status: AC | PRN
Start: 2022-02-03 — End: ?

## 2022-02-03 MED ORDER — DEXAMETHASONE 4 MG PO TABS
4 MG | Freq: Once | ORAL | Status: AC
Start: 2022-02-03 — End: 2022-02-03
  Administered 2022-02-03: 08:00:00 12 mg via ORAL

## 2022-02-03 MED ORDER — NICOTINE 14 MG/24HR TD PT24
14 MG/24HR | MEDICATED_PATCH | Freq: Every day | TRANSDERMAL | 0 refills | Status: AC
Start: 2022-02-03 — End: 2022-02-17

## 2022-02-03 MED FILL — DEXAMETHASONE 4 MG PO TABS: 4 MG | ORAL | Qty: 3

## 2022-02-03 MED FILL — IPRATROPIUM-ALBUTEROL 0.5-2.5 (3) MG/3ML IN SOLN: RESPIRATORY_TRACT | Qty: 3

## 2022-02-03 MED FILL — NICOTINE 14 MG/24HR TD PT24: 14 MG/24HR | TRANSDERMAL | Qty: 1

## 2022-02-03 NOTE — Discharge Instructions (Signed)
Thank you!  Thank you for allowing me to care for you in the emergency department. It is my goal to provide you with excellent care. If you have not received excellent quality care, please ask to speak to the nurse manager. Please fill out the survey that will come to you by mail or email since we listen to your feedback!     Below you will find a list of your tests from today's visit.  Should you have any questions, please do not hesitate to call the emergency department.    Labs  Recent Results (from the past 12 hour(s))   CBC with Auto Differential    Collection Time: 02/03/22 12:31 AM   Result Value Ref Range    WBC 7.0 4.1 - 11.1 K/uL    RBC 4.56 4.10 - 5.70 M/uL    Hemoglobin 14.4 12.1 - 17.0 g/dL    Hematocrit 41.0 36.6 - 50.3 %    MCV 89.9 80.0 - 99.0 FL    MCH 31.6 26.0 - 34.0 PG    MCHC 35.1 30.0 - 36.5 g/dL    RDW 12.6 11.5 - 14.5 %    Platelets 126 (L) 150 - 400 K/uL    MPV 11.1 8.9 - 12.9 FL    Nucleated RBCs 0.0 0.0 PER 100 WBC    nRBC 0.00 0.00 - 0.01 K/uL    Neutrophils % 60 32 - 75 %    Lymphocytes % 30 12 - 49 %    Monocytes % 7 5 - 13 %    Eosinophils % 3 0 - 7 %    Basophils % 0 0 - 1 %    Immature Granulocytes 0 0 - 0.5 %    Neutrophils Absolute 4.1 1.8 - 8.0 K/UL    Lymphocytes Absolute 2.1 0.8 - 3.5 K/UL    Monocytes Absolute 0.5 0.0 - 1.0 K/UL    Eosinophils Absolute 0.2 0.0 - 0.4 K/UL    Basophils Absolute 0.0 0.0 - 0.1 K/UL    Absolute Immature Granulocyte 0.0 0.00 - 0.04 K/UL    Differential Type AUTOMATED     Comprehensive Metabolic Panel    Collection Time: 02/03/22 12:31 AM   Result Value Ref Range    Sodium 139 136 - 145 mmol/L    Potassium 3.5 3.5 - 5.1 mmol/L    Chloride 108 97 - 108 mmol/L    CO2 26 21 - 32 mmol/L    Anion Gap 5 5 - 15 mmol/L    Glucose 117 (H) 65 - 100 mg/dL    BUN 16 6 - 20 mg/dL    Creatinine 1.29 0.70 - 1.30 mg/dL    Bun/Cre Ratio 12 12 - 20      Est, Glom Filt Rate >60 >60 ml/min/1.38m    Calcium 8.8 8.5 - 10.1 mg/dL    Total Bilirubin 1.4 (H) 0.2 - 1.0  mg/dL    AST 21 15 - 37 U/L    ALT 29 12 - 78 U/L    Alk Phosphatase 63 45 - 117 U/L    Total Protein 7.1 6.4 - 8.2 g/dL    Albumin 3.7 3.5 - 5.0 g/dL    Globulin 3.4 2.0 - 4.0 g/dL    Albumin/Globulin Ratio 1.1 1.1 - 2.2     Magnesium    Collection Time: 02/03/22 12:31 AM   Result Value Ref Range    Magnesium 2.0 1.6 - 2.4 mg/dL   Brain Natriuretic Peptide  Collection Time: 02/03/22 12:31 AM   Result Value Ref Range    NT Pro-BNP 214 (H) <125 pg/mL   COVID-19, Rapid    Collection Time: 02/03/22  1:57 AM    Specimen: Nasopharyngeal   Result Value Ref Range    SARS-CoV-2, Rapid Not Detected Not Detected         Radiologic Studies  XR CHEST PORTABLE   Final Result   Mild diffuse pulmonary edema.   Cardiomegaly.            ------------------------------------------------------------------------------------------------------------  The exam and treatment you received in the Emergency Department were for an urgent problem and are not intended as complete care. It is important that you follow-up with a doctor, nurse practitioner, or physician assistant to:  (1) confirm your diagnosis,  (2) re-evaluation of changes in your illness and treatment, and  (3) for ongoing care. Please take your discharge instructions with you when you go to your follow-up appointment.     If you have any problem arranging a follow-up appointment, contact the Emergency Department.  If your symptoms become worse or you do not improve as expected and you are unable to reach your health care provider, please return to the Emergency Department. We are available 24 hours a day.     If a prescription has been provided, please have it filled as soon as possible to prevent a delay in treatment. If you have any questions or reservations about taking the medication due to side effects or interactions with other medications, please call your primary care provider or contact the ER.

## 2022-02-03 NOTE — ED Triage Notes (Signed)
States he had a lot of nasal drainage today and took a zyrtec. Started coughing so hard it felt like his airway was going to collapse. Took some robitussin and laid down to wake up with coughing and the same airway difficulties.

## 2022-02-06 LAB — EKG 12-LEAD
Atrial Rate: 62 {beats}/min
Diagnosis: NORMAL
P Axis: 60 degrees
P-R Interval: 180 ms
Q-T Interval: 438 ms
QRS Duration: 128 ms
QTc Calculation (Bazett): 444 ms
R Axis: -53 degrees
T Axis: 86 degrees
Ventricular Rate: 62 {beats}/min

## 2022-02-13 ENCOUNTER — Emergency Department: Admit: 2022-02-13 | Payer: MEDICAID

## 2022-02-13 ENCOUNTER — Inpatient Hospital Stay
Admit: 2022-02-13 | Discharge: 2022-02-13 | Disposition: A | Discharge status: Home / Self Care | Payer: Medicaid Other | Attending: Emergency Medicine

## 2022-02-13 DIAGNOSIS — R109 Unspecified abdominal pain: Secondary | ICD-10-CM

## 2022-02-13 LAB — COMPREHENSIVE METABOLIC PANEL
ALT: 35 U/L (ref 12–78)
Albumin/Globulin Ratio: 1 — ABNORMAL LOW (ref 1.1–2.2)
Albumin: 3.8 g/dL (ref 3.5–5.0)
Alk Phosphatase: 72 U/L (ref 45–117)
Anion Gap: 6 mmol/L (ref 5–15)
BUN: 11 mg/dL (ref 6–20)
Bun/Cre Ratio: 8 — ABNORMAL LOW (ref 12–20)
CO2: 25 mmol/L (ref 21–32)
Calcium: 9.1 mg/dL (ref 8.5–10.1)
Chloride: 107 mmol/L (ref 97–108)
Creatinine: 1.32 mg/dL — ABNORMAL HIGH (ref 0.70–1.30)
Est, Glom Filt Rate: >60 mL/min/{1.73_m2} (ref >60)
Globulin: 3.8 g/dL (ref 2.0–4.0)
Glucose: 114 mg/dL — ABNORMAL HIGH (ref 65–100)
Sodium: 138 mmol/L (ref 136–145)
Total Bilirubin: 0.9 mg/dL (ref 0.2–1.0)
Total Protein: 7.6 g/dL (ref 6.4–8.2)

## 2022-02-13 LAB — URINALYSIS WITH REFLEX TO CULTURE
BACTERIA, URINE: NEGATIVE /hpf
Bilirubin Urine: NEGATIVE
Blood, Urine: NEGATIVE
Glucose, UA: NEGATIVE mg/dL
Ketones, Urine: NEGATIVE mg/dL
Leukocyte Esterase, Urine: NEGATIVE
Nitrite, Urine: NEGATIVE
Protein, UA: NEGATIVE mg/dL
Specific Gravity, UA: 1.017 (ref 1.003–1.030)
Urobilinogen, Urine: 2 EU/dL — ABNORMAL HIGH (ref 0.1–1.0)
pH, Urine: 5 (ref 5.0–8.0)

## 2022-02-13 LAB — CBC WITH AUTO DIFFERENTIAL
Absolute Immature Granulocyte: 0 10*3/uL (ref 0.00–0.04)
Basophils %: 1 % (ref 0–1)
Basophils Absolute: 0 10*3/uL (ref 0.0–0.1)
Eosinophils %: 3 % (ref 0–7)
Eosinophils Absolute: 0.2 10*3/uL (ref 0.0–0.4)
Hematocrit: 46.4 % (ref 36.6–50.3)
Hemoglobin: 16 g/dL (ref 12.1–17.0)
Immature Granulocytes: 0 % (ref 0–0.5)
Lymphocytes %: 31 % (ref 12–49)
Lymphocytes Absolute: 2.1 10*3/uL (ref 0.8–3.5)
MCH: 31.4 PG (ref 26.0–34.0)
MCHC: 34.5 g/dL (ref 30.0–36.5)
MCV: 91 FL (ref 80.0–99.0)
MPV: 11.4 FL (ref 8.9–12.9)
Monocytes %: 7 % (ref 5–13)
Monocytes Absolute: 0.5 10*3/uL (ref 0.0–1.0)
Neutrophils %: 58 % (ref 32–75)
Neutrophils Absolute: 3.8 10*3/uL (ref 1.8–8.0)
Nucleated RBCs: 0 PER 100 WBC
Platelets: 153 10*3/uL (ref 150–400)
RBC: 5.1 M/uL (ref 4.10–5.70)
RDW: 12.8 % (ref 11.5–14.5)
WBC: 6.6 10*3/uL (ref 4.1–11.1)
nRBC: 0 10*3/uL (ref 0.00–0.01)

## 2022-02-13 MED ORDER — CYCLOBENZAPRINE HCL 5 MG PO TABS
5 MG | ORAL_TABLET | Freq: Three times a day (TID) | ORAL | 0 refills | Status: AC | PRN
Start: 2022-02-13 — End: 2022-02-23

## 2022-02-13 MED ORDER — ONDANSETRON HCL 4 MG/2ML IJ SOLN
4 MG/2ML | Freq: Once | INTRAMUSCULAR | Status: AC
Start: 2022-02-13 — End: 2022-02-13
  Administered 2022-02-13: 16:00:00 4 mg via INTRAVENOUS

## 2022-02-13 MED ORDER — ACETAMINOPHEN 500 MG PO TABS
500 MG | ORAL_TABLET | Freq: Four times a day (QID) | ORAL | 0 refills | Status: AC | PRN
Start: 2022-02-13 — End: ?

## 2022-02-13 MED ORDER — MORPHINE SULFATE 4 MG/ML IV SOLN
4 MG/ML | Freq: Once | INTRAVENOUS | Status: AC
Start: 2022-02-13 — End: 2022-02-13
  Administered 2022-02-13: 16:00:00 4 mg via INTRAVENOUS

## 2022-02-13 MED ORDER — SODIUM CHLORIDE 0.9 % IV BOLUS
0.9 % | Freq: Once | INTRAVENOUS | Status: AC
Start: 2022-02-13 — End: 2022-02-13
  Administered 2022-02-13: 16:00:00 1000 mL via INTRAVENOUS

## 2022-02-13 MED FILL — SODIUM CHLORIDE 0.9 % IV SOLN: 0.9 % | INTRAVENOUS | Qty: 1000

## 2022-02-13 MED FILL — ONDANSETRON HCL 4 MG/2ML IJ SOLN: 4 MG/2ML | INTRAMUSCULAR | Qty: 2

## 2022-02-13 MED FILL — MORPHINE SULFATE 4 MG/ML IV SOLN: 4 mg/mL | INTRAVENOUS | Qty: 1

## 2022-02-13 NOTE — ED Provider Notes (Signed)
[facility] EMERGENCY DEPT  EMERGENCY DEPARTMENT HISTORY AND PHYSICAL EXAM      Date: 02/13/2022  Patient Name: Christopher Peters  MRN: [id number]  Birthdate Sep 28, 1960  Date of evaluation: 02/13/2022  Provider: Vladimir Crofts, MD   Note Started: 12:20 PM EDT 02/13/22    HISTORY OF PRESENT ILLNESS     Chief Complaint   Patient presents with   . Flank Pain       History Provided By: Patient    HPI: Christopher Peters is a 61 y.o. male PMH of HTN, nephrolithiasis, and left nephrectomy comes to the ED with acute onset of right flank pain.  This started couple days ago and waxing and waning in severity.  This is associated with dark-colored urine.  He does not have any dysuria, fever or chills.  He denies any trauma to his back.  Pain is nonradiating without specific aggravating leaving factors without association symptoms.  He otherwise reports no changes in his bowel or dietary habits.  Denies pain in anywhere else.  Reports since he was seen here for his redness of breath has been doing well till this episode of flank pain couple days ago.    PAST MEDICAL HISTORY   Past Medical History:  Past Medical History:   Diagnosis Date   . Hypertension    . Hypertension    . Kidney stone    . Kidney stones        Past Surgical History:  Past Surgical History:   Procedure Laterality Date   . FRACTURE SURGERY     . KIDNEY REMOVAL Left        Family History:  History reviewed. No pertinent family history.    Social History:  Social History     Tobacco Use   . Smoking status: Every Day     Packs/day: 1     Types: Cigarettes   . Smokeless tobacco: Never   Substance Use Topics   . Alcohol use: Not Currently   . Drug use: Never       Allergies:  Allergies   Allergen Reactions   . Codeine        PCP: No primary care provider on file.    Current Meds:   No current facility-administered medications for this encounter.     Current Outpatient Medications   Medication Sig Dispense Refill   . acetaminophen (TYLENOL) 500 MG tablet Take 2 tablets by  mouth every 6 hours as needed for Pain 40 tablet 0   . cyclobenzaprine (FLEXERIL) 5 MG tablet Take 1 tablet by mouth 3 times daily as needed for Muscle spasms 15 tablet 0   . albuterol sulfate HFA (VENTOLIN HFA) 108 (90 Base) MCG/ACT inhaler Inhale 2 puffs into the lungs 4 times daily as needed for Wheezing 18 g 0   . nicotine (NICODERM CQ) 14 MG/24HR Place 1 patch onto the skin daily for 14 days 14 patch 0   . atenolol (TENORMIN) 50 MG tablet Take 50 mg by mouth daily     . Cholecalciferol (VITAMIN D3) 2000 units CAPS Take by mouth     . promethazine (PHENERGAN) 25 MG tablet Take 1 tablet by mouth every 6 hours as needed for Nausea 15 tablet 0       Social Determinants of Health:   Social Determinants of Health     Tobacco Use: High Risk (02/13/2022)    Patient History    . Smoking Tobacco Use: Every Day    .  Smokeless Tobacco Use: Never    . Passive Exposure: Not on file   Alcohol Use: Not on file   Financial Resource Strain: Not on file   Food Insecurity: Not on file   Transportation Needs: Not on file   Physical Activity: Not on file   Stress: Not on file   Social Connections: Not on file   Intimate Partner Violence: Not on file   Depression: Not on file   Housing Stability: Not on file       PHYSICAL EXAM   Physical Exam  Vitals and nursing note reviewed.   Constitutional:       General: He is in acute distress.      Appearance: He is not ill-appearing, toxic-appearing or diaphoretic.   HENT:      Head: Normocephalic and atraumatic.   Eyes:      Extraocular Movements: Extraocular movements intact.      Conjunctiva/sclera: Conjunctivae normal.   Cardiovascular:      Rate and Rhythm: Normal rate and regular rhythm.   Pulmonary:      Effort: Pulmonary effort is normal.      Breath sounds: Normal breath sounds.   Abdominal:      General: There is no distension.      Palpations: Abdomen is soft.      Tenderness: There is no abdominal tenderness. There is right CVA tenderness.   Neurological:      General: No focal  deficit present.      Mental Status: He is alert and oriented to person, place, and time.         SCREENINGS               LAB, EKG AND DIAGNOSTIC RESULTS   Labs:  No results found for this or any previous visit (from the past 12 hour(s)).      Radiologic Studies:  Non-plain film images such as CT, Ultrasound and MRI are read by the radiologist. Plain radiographic images are visualized and preliminarily interpreted by the ED Provider with the following findings: See ED Course Below    Interpretation per the Radiologist below, if available at the time of this note:  CT ABDOMEN PELVIS WO CONTRAST Additional Contrast? None   Final Result   Status post left nephrectomy. No renal calculi or evidence of   obstructive uropathy.               EMERGENCY DEPARTMENT COURSE and DIFFERENTIAL DIAGNOSIS/MDM   CC/HPI Summary, DDx, ED Course, and Reassessment:     Records Reviewed (source and summary of external notes): Prior medical records and Nursing notes    Vitals:    Vitals:    02/13/22 1155 02/13/22 1555   BP: (!) 210/93 138/66   Pulse: 69 65   Resp: 17    Temp: 97.6 F (36.4 C)    TempSrc: Oral    SpO2: 99% 99%   Weight: 124.7 kg (275 lb)    Height: 1.753 m (5\' 9" )        ED Course as of 02/14/22 2248   Sun Feb 13, 2022   1222 61 y.o. male PMH of HTN, nephrolithiasis, and left nephrectomy comes to the ED with acute onset of right flank pain.  Differential diagnoses include nephrolithiasis, pyonephritis, musculoskeletal pain.  We will get labs include CBC, chemistry, urinalysis and do CT of the abdomen without contrast.  Patient symptoms be treated with antiemetics and analgesics.  Also will intravenously hydrate the patient.  We  will evaluate his urine for signs of any tract infection and hematuria. [AA]   1604 I independently reviewed and interpreted the patient work-up.  CBC without leukocytosis, anemia, or abnormal platelet count.  Chemistry without electrolyte derangement, acute kidney injury, or abnormal liver  functions.  Also without evidence urinary tract infection or hematuria.  CT abdomen interpreted by the radiologist as negative for acute findings.  His symptoms unlikely to be secondary to musculoskeletal pain.  Patient symptoms improved.  Discussed outpatient treatment Tylenol and Robaxin follow-up with his primary care doctor for reassessment. [AA]   1620 After completion of workup and discussion of results and diagnoses with the patient, all the patient's questions were answered. If required, all follow up appointments and treatments were discussed and explained. The patient sounded understanding and agrees with the plan.    The patient understands that at this time there is no evidence for a more malignant underlying process, but the patient also understands that early in the process of an illness, an emergency department workup can be falsely reassuring. Routine discharge counseling was given to the patient and the patient understands that worsening, changing or persistent symptoms should prompt an immediate call or follow up with their primary physician or the emergency department. The importance of appropriate follow up was also discussed with the patient. More extensive discharge instructions were given in the patient's discharge paperwork. [AA]      ED Course User Index  [AA] Dwyane Luo, MD       Patient was given the following medications:  Medications   sodium chloride 0.9 % bolus 1,000 mL (0 mLs IntraVENous Stopped 02/13/22 1523)   morphine injection 4 mg (4 mg IntraVENous Given 02/13/22 1220)   ondansetron (ZOFRAN) injection 4 mg (4 mg IntraVENous Given 02/13/22 1219)     ED FINAL IMPRESSION     1. Flank pain          DISPOSITION/PLAN   DISPOSITION Decision To Discharge 02/13/2022 03:51:32 PM    Discharge Note: The patient is stable for discharge home. The signs, symptoms, diagnosis, and discharge instructions have been discussed, understanding conveyed, and agreed upon. The patient is to follow up  as recommended or return to ER should their symptoms worsen.      PATIENT REFERRED TO:  [facility] EMERGENCY DEPT  [place]  [phone]  Go to   As needed, If symptoms worsen    PCP    Schedule an appointment as soon as possible for a visit in 2 days  For reevaluation, For follow-up        DISCHARGE MEDICATIONS:     Medication List        START taking these medications      acetaminophen 500 MG tablet  Commonly known as: TYLENOL  Take 2 tablets by mouth every 6 hours as needed for Pain     cyclobenzaprine 5 MG tablet  Commonly known as: FLEXERIL  Take 1 tablet by mouth 3 times daily as needed for Muscle spasms            ASK your doctor about these medications      albuterol sulfate HFA 108 (90 Base) MCG/ACT inhaler  Commonly known as: Ventolin HFA  Inhale 2 puffs into the lungs 4 times daily as needed for Wheezing     atenolol 50 MG tablet  Commonly known as: TENORMIN     nicotine 14 MG/24HR  Commonly known as: NICODERM CQ  Place 1 patch onto the skin daily for 14 days     promethazine 25 MG tablet  Commonly known as: PHENERGAN  Take 1 tablet by mouth every 6 hours as needed for Nausea     Vitamin D3 50 MCG (2000 UT) Caps               Where to Get Your Medications        These medications were sent to [id number] [place], [place] - [address] - P [phone] [place] [phone]  [address] [place], [place] [address]      Phone: [phone]   acetaminophen 500 MG tablet  cyclobenzaprine 5 MG tablet           DISCONTINUED MEDICATIONS:  Discharge Medication List as of 02/13/2022  4:32 PM          I am the Primary Clinician of Record. Princess Karnes Nils Pyle, MD (electronically signed)    (Please note that parts of this dictation were completed with voice recognition software. Quite often unanticipated grammatical, syntax, homophones, and other interpretive errors are inadvertently transcribed by the computer software. Please disregards these errors. Please excuse any  errors that have escaped final proofreading.)     Vladimir Crofts, MD  02/14/22 2248

## 2022-02-13 NOTE — ED Triage Notes (Signed)
Patient complains of right flank pain that has been hurting for the last few days, hx of kidney stone and has had the left kidney removed.

## 2023-04-12 ENCOUNTER — Inpatient Hospital Stay
Admit: 2023-04-12 | Discharge: 2023-04-12 | Disposition: A | Discharge status: Home / Self Care | Admitting: Emergency Medicine

## 2023-04-12 DIAGNOSIS — K760 Fatty (change of) liver, not elsewhere classified: Secondary | ICD-10-CM

## 2023-04-12 LAB — URINALYSIS WITH REFLEX TO CULTURE
BACTERIA, URINE: NEGATIVE /[HPF]
Blood, Urine: NEGATIVE
Glucose, Ur: NEGATIVE mg/dL
Leukocyte Esterase, Urine: NEGATIVE
Nitrite, Urine: NEGATIVE
Specific Gravity, UA: 1.024
Urobilinogen, Urine: 2 U/dL — ABNORMAL HIGH (ref 0.2–1.0)
pH, Urine: 5.5 (ref 5.0–8.0)

## 2023-04-12 LAB — BASIC METABOLIC PANEL
Anion Gap: 7 mmol/L (ref 2–12)
BUN/Creatinine Ratio: 9 — ABNORMAL LOW (ref 12–20)
BUN: 14 mg/dL (ref 6–20)
CO2: 26 mmol/L (ref 21–32)
Calcium: 9.4 mg/dL (ref 8.5–10.1)
Chloride: 106 mmol/L (ref 97–108)
Creatinine: 1.58 mg/dL — ABNORMAL HIGH (ref 0.70–1.30)
Est, Glom Filt Rate: 49 mL/min/{1.73_m2} — ABNORMAL LOW (ref >60)
Glucose: 92 mg/dL (ref 65–100)
Potassium: 3.6 mmol/L (ref 3.5–5.1)
Sodium: 139 mmol/L (ref 136–145)

## 2023-04-12 LAB — CBC WITH AUTO DIFFERENTIAL
Basophils %: 1 % (ref 0–1)
Basophils Absolute: 0 10*3/uL (ref 0.0–0.1)
Eosinophils %: 4 % (ref 0–7)
Eosinophils Absolute: 0.2 10*3/uL (ref 0.0–0.4)
Hematocrit: 46.3 % (ref 36.6–50.3)
Hemoglobin: 15.5 g/dL (ref 12.1–17.0)
Immature Granulocytes %: 0 % (ref 0.0–0.5)
Immature Granulocytes Absolute: 0 10*3/uL (ref 0.00–0.04)
Lymphocytes %: 29 % (ref 12–49)
Lymphocytes Absolute: 1.8 10*3/uL (ref 0.8–3.5)
MCH: 30.9 pg (ref 26.0–34.0)
MCHC: 33.5 g/dL (ref 30.0–36.5)
MCV: 92.4 fL (ref 80.0–99.0)
MPV: 11.1 fL (ref 8.9–12.9)
Monocytes %: 8 % (ref 5–13)
Monocytes Absolute: 0.5 10*3/uL (ref 0.0–1.0)
Neutrophils %: 58 % (ref 32–75)
Neutrophils Absolute: 3.8 10*3/uL (ref 1.8–8.0)
Nucleated RBCs: 0 /100{WBCs}
Platelets: 149 10*3/uL — ABNORMAL LOW (ref 150–400)
RBC: 5.01 M/uL (ref 4.10–5.70)
RDW: 13.1 % (ref 11.5–14.5)
WBC: 6.3 10*3/uL (ref 4.1–11.1)
nRBC: 0 10*3/uL (ref 0.00–0.01)

## 2023-04-12 LAB — BILIRUBIN, CONFIRMATORY: Bilirubin Confirmation, UA: NEGATIVE

## 2023-04-12 MED ORDER — MORPHINE SULFATE (PF) 4 MG/ML IJ SOLN
4 | INTRAMUSCULAR | Status: AC
Start: 2023-04-12 — End: 2023-04-12
  Administered 2023-04-12: 23:00:00 4 mg via INTRAVENOUS

## 2023-04-12 MED ORDER — ONDANSETRON HCL 4 MG/2ML IJ SOLN
4 | Freq: Once | INTRAMUSCULAR | Status: AC
Start: 2023-04-12 — End: 2023-04-12
  Administered 2023-04-12: 21:00:00 4 mg via INTRAVENOUS

## 2023-04-12 MED ORDER — MORPHINE SULFATE (PF) 4 MG/ML IJ SOLN
4 | INTRAMUSCULAR | Status: AC
Start: 2023-04-12 — End: 2023-04-12
  Administered 2023-04-12: 21:00:00 4 mg via INTRAVENOUS

## 2023-04-12 MED ORDER — METHOCARBAMOL 750 MG PO TABS
750 | ORAL_TABLET | Freq: Four times a day (QID) | ORAL | 0 refills | Status: AC | PRN
Start: 2023-04-12 — End: 2023-04-22

## 2023-04-12 MED FILL — MORPHINE SULFATE 4 MG/ML IJ SOLN: 4 mg/mL | INTRAMUSCULAR | Qty: 1

## 2023-04-12 MED FILL — ONDANSETRON HCL 4 MG/2ML IJ SOLN: 4 MG/2ML | INTRAMUSCULAR | Qty: 2

## 2023-04-12 NOTE — Discharge Instructions (Addendum)
 [facility] Departments     For adult and child immunizations, family planning, TB screening, STD testing and women's health services.   [place]: [place] [phone]      [place] [phone]    [facility]   [phone]

## 2023-04-12 NOTE — ED Provider Notes (Signed)
 [facility] EMERGENCY DEPT  EMERGENCY DEPARTMENT ENCOUNTER       Pt Name: Christopher Peters  MRN: [id number]  Birthdate [birth year]  Date of evaluation: 04/12/2023  Provider: Neldon Newport, MD   PCP: No primary care provider on file.  Note Started: 3:18 PM EST 11/20

## 2023-08-13 ENCOUNTER — Emergency Department: Payer: Self-pay

## 2023-08-13 ENCOUNTER — Emergency Department
Admission: EM | Admit: 2023-08-13 | Discharge: 2023-08-13 | Disposition: A | Discharge status: Home / Self Care | Payer: Self-pay | Attending: Emergency Medicine | Admitting: Emergency Medicine

## 2023-08-13 DIAGNOSIS — M545 Low back pain, unspecified: Secondary | ICD-10-CM | POA: Insufficient documentation

## 2023-08-13 DIAGNOSIS — R1031 Right lower quadrant pain: Secondary | ICD-10-CM | POA: Insufficient documentation

## 2023-08-13 DIAGNOSIS — I10 Essential (primary) hypertension: Secondary | ICD-10-CM | POA: Insufficient documentation

## 2023-08-13 LAB — COMPREHENSIVE METABOLIC PANEL
ALT: 24 U/L (ref 0–44)
AST: 21 U/L (ref 15–41)
Albumin: 4.1 g/dL (ref 3.5–5.0)
Alkaline Phosphatase: 61 U/L (ref 38–126)
Anion gap: 7 (ref 5–15)
BUN: 16 mg/dL (ref 8–23)
CO2: 27 mmol/L (ref 22–32)
Calcium: 9.3 mg/dL (ref 8.9–10.3)
Chloride: 106 mmol/L (ref 98–111)
Creatinine, Ser: 1.62 mg/dL — ABNORMAL HIGH (ref 0.61–1.24)
GFR, Estimated: 48 mL/min — ABNORMAL LOW (ref >60)
Glucose, Bld: 126 mg/dL — ABNORMAL HIGH (ref 70–99)
Potassium: 4.3 mmol/L (ref 3.5–5.1)
Sodium: 140 mmol/L (ref 135–145)
Total Bilirubin: 0.9 mg/dL (ref 0.0–1.2)
Total Protein: 7.5 g/dL (ref 6.5–8.1)

## 2023-08-13 LAB — CBC WITH DIFFERENTIAL/PLATELET
Abs Immature Granulocytes: 0.02 10*3/uL (ref 0.00–0.07)
Basophils Absolute: 0 10*3/uL (ref 0.0–0.1)
Basophils Relative: 1 %
Eosinophils Absolute: 0.2 10*3/uL (ref 0.0–0.5)
Eosinophils Relative: 3 %
HCT: 44.6 % (ref 39.0–52.0)
Hemoglobin: 15.4 g/dL (ref 13.0–17.0)
Immature Granulocytes: 0 %
Lymphocytes Relative: 27 %
Lymphs Abs: 1.6 10*3/uL (ref 0.7–4.0)
MCH: 32 pg (ref 26.0–34.0)
MCHC: 34.5 g/dL (ref 30.0–36.0)
MCV: 92.5 fL (ref 80.0–100.0)
Monocytes Absolute: 0.4 10*3/uL (ref 0.1–1.0)
Monocytes Relative: 7 %
Neutro Abs: 3.9 10*3/uL (ref 1.7–7.7)
Neutrophils Relative %: 62 %
Platelets: 144 10*3/uL — ABNORMAL LOW (ref 150–400)
RBC: 4.82 MIL/uL (ref 4.22–5.81)
RDW: 13.5 % (ref 11.5–15.5)
WBC: 6.2 10*3/uL (ref 4.0–10.5)
nRBC: 0 % (ref 0.0–0.2)

## 2023-08-13 LAB — URINALYSIS, ROUTINE W REFLEX MICROSCOPIC
Bilirubin Urine: NEGATIVE
Glucose, UA: NEGATIVE mg/dL
Hgb urine dipstick: NEGATIVE
Ketones, ur: NEGATIVE mg/dL
Leukocytes,Ua: NEGATIVE
Nitrite: NEGATIVE
Protein, ur: NEGATIVE mg/dL
Specific Gravity, Urine: 1.02 (ref 1.005–1.030)
pH: 6 (ref 5.0–8.0)

## 2023-08-13 MED ORDER — HYDROCODONE-ACETAMINOPHEN 5-325 MG PO TABS: 1.0000 | ORAL_TABLET | ORAL | 0 refills | Status: DC | PRN

## 2023-08-13 MED ORDER — MORPHINE SULFATE (PF) 4 MG/ML IV SOLN
4.0000 mg | Freq: Once | INTRAVENOUS | Status: AC
  Administered 2023-08-13: 4 mg via INTRAVENOUS
  Filled 2023-08-13: qty 1

## 2023-08-13 MED ORDER — SODIUM CHLORIDE 0.9 % IV BOLUS
1000.0000 mL | Freq: Once | INTRAVENOUS | Status: AC
  Administered 2023-08-13: 1000 mL via INTRAVENOUS

## 2023-08-13 MED ORDER — ONDANSETRON HCL 4 MG/2ML IJ SOLN
4.0000 mg | Freq: Once | INTRAMUSCULAR | Status: AC
  Administered 2023-08-13: 4 mg via INTRAVENOUS
  Filled 2023-08-13: qty 2

## 2023-08-13 NOTE — ED Notes (Signed)
 Patient states he can't void at this time, but was given a urinal.

## 2023-08-13 NOTE — ED Provider Notes (Signed)
 [facility] Provider Note    Event Date/Time   First MD Initiated Contact with Patient 08/13/23 1416     (approximate)  History   Chief Complaint: Back Pain  HPI  Kee Drudge is a 63 y.o. male with a past medical history of hypertension, kidney stones, back pain, presents to the emergency department for right flank pain.  According to the patient for the past 3 to 4 weeks intermittently he has been painting pain in his right lower back and at times radiating around to his right lower quadrant.  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  Patient states approximately 2 to 3 weeks ago he was diagnosed with a possible urinary tract infection and completed a course of Keflex.  Patient has not sure if the back pain is more musculoskeletal due to the back or due to possible kidney stones or kidney infection so he came to the emergency department for evaluation.  No fever.  No noted hematuria.  Physical Exam   Triage Vital Signs: ED Triage Vitals  Encounter Vitals Group     BP 08/13/23 1341 (!) 190/89     Systolic BP Percentile --      Diastolic BP Percentile --      Pulse Rate 08/13/23 1341 74     Resp 08/13/23 1341 18     Temp 08/13/23 1341 98.7 F (37.1 C)     Temp Source 08/13/23 1341 Oral     SpO2 08/13/23 1341 94 %     Weight 08/13/23 1342 275 lb (124.7 kg)     Height 08/13/23 1342 5\' 9"  (1.753 m)     Head Circumference --      Peak Flow --      Pain Score 08/13/23 1341 8     Pain Loc --      Pain Education --      Exclude from Growth Chart --     Most recent vital signs: Vitals:   08/13/23 1341  BP: (!) 190/89  Pulse: 74  Resp: 18  Temp: 98.7 F (37.1 C)  SpO2: 94%    General: Awake, no distress.  CV:  Good peripheral perfusion.  Regular rate and rhythm  Resp:  Normal effort.  Equal breath sounds bilaterally.  Abd:  No distention.  Soft,  nontender.  No rebound or guarding.  No CVA tenderness.  ED Results / Procedures / Treatments   RADIOLOGY  I have reviewed interpret the CT images.  No obvious kidney stone seen on my evaluation, 1 kidney present. Radiologist read the CT scan is negative for acute abnormality.   MEDICATIONS ORDERED IN ED: Medications  sodium chloride 0.9 % bolus 1,000 mL (has no administration in time range)  morphine (PF) 4 MG/ML injection 4 mg (has no administration in time range)  ondansetron (ZOFRAN) injection 4 mg (has no administration in time range)     IMPRESSION / MDM / ASSESSMENT AND PLAN / ED COURSE  I reviewed the triage vital signs and the nursing notes.  Patient's presentation is most consistent with acute presentation with potential threat to life or bodily function.  Patient presents emergency department for lower back pain and right flank pain ongoing intermittently over the last 3 to 4 weeks.  The patient's lab work today shows a reassuring CBC with a normal white blood cell count, chemistry reassuring mild increase in creatinine  over the patient's chronic kidney disease given the patient's history of only having 1 kidney we will place an IV to IV hydrate the patient.  Will also obtain CT imaging of the abdomen/pelvis to evaluate for ureteral stone or other acute abnormality we will also check a urine sample.  Patient agreeable to plan of care.  We will treat pain nausea and IV hydrate while awaiting results.  CT scan is negative, urinalysis shows no concerning findings, CBC and chemistry showed no significant finding either.  Patient has received IV fluids.  He is feeling better after medication.  We will discharge with a short course of pain medication suspect more musculoskeletal or nerve related pain.  Patient will follow-up with his doctor.  FINAL CLINICAL IMPRESSION(S) / ED DIAGNOSES   Back pain Right flank pain  Note:  This document was prepared using Dragon voice recognition  software and may include unintentional dictation errors.   Minna Antis, MD 08/13/23 [phone]

## 2023-08-13 NOTE — ED Triage Notes (Signed)
 Pt presents to the ED POV from home with right sided back pain x3 weeks that has steadily worsened. Pt reports a hx of kidney stones. States that he had over 60 stones in the left kidney and had to have a nephrectomy of the left kidney. Pt reports that the pain on the right side of his back feels like a kidney stone. Pt A&Ox4 a time of triage. Pt states that his son dropped him off.

## 2023-12-03 NOTE — ED Provider Notes (Signed)
 First Contact       Date/Time Event User Comments Last Edited    12/03/23 1137 Time provider first saw patient Christopher Peters, Christopher Peters -- 12/03/23 1137            History     Chief Complaint   Patient presents with   . Flank Pain     Right flank pain, hurts to pee.   Hx of stones, had left kidney removed due to stones.         History provided by:  Patient  Language interpreter used: No      Pt was evaluated by provider at: 11:37    Christopher Peters is a 63 y.o. male who presents to the ED with flank pain. Pt states that he has been endorsing back pain that is radiating to the right side. Pt states that the pain is chronic but has gotten worse in the last 4-5 days. Pt also states that he has been endorsing burning while urinating for the last 2-3 days. Pt states that his left kidney is removed for having frequent stones. Pt denies any n/v/d. No further comments or concerns.       Past Medical History:   Diagnosis Date   . Hernia, hiatal    . Hypertension    . Renal disorder    . Umbilical hernia        Past Surgical History:   Procedure Laterality Date   . FRACTURE SURGERY      left hand    . kidney removal, left          No family history on file.    Social History     Tobacco Use   . Smoking status: Every Day     Current packs/day: 1.00     Types: Cigarettes   . Smokeless tobacco: Never   Substance Use Topics   . Alcohol use: No   . Drug use: No       Allergies[1]    Prior to Admission medications   Medication Sig Start Date End Date Taking? Authorizing Provider   amoxicillin-clavulanate (AUGMENTIN) 875-125 mg tablet Take ONE tablet by mouth every 12 (twelve) hours. 12/06/20   Larry Leontine Gull, MD   amoxicillin-clavulanate (AUGMENTIN) 875-125 mg tablet Take ONE tablet by mouth every 12 (twelve) hours. 12/10/20   Cherisse Norleen Cough, MD   atenolol (TENORMIN) 50 MG tablet Take 50 mg by mouth daily.    [provider]   HYDROcodone-acetaminophen  (NORCO) 5-325 mg tablet Take ONE tablet by mouth every 6 (six) hours  as needed for Pain. 11/20/23   Johnston Lonni Charleston, MD   lidocaine (LIDODERM) 5 % Place ONE patch onto the skin daily. Remove & Discard patch within 12 hours or as directed by MD 09/20/21   Denault, Mace, PA   lidocaine (LIDODERM) 5 % Place ONE patch onto the skin daily as needed for Relief of pain associated with postherpetic neuralgia or other chronic neuropathic pain conditions. Remove & Discard patch within 12 hours or as directed by MD 10/02/22   Eyvonne Stabs, PA       Review of Systems   Constitutional:  Negative for diaphoresis and fever.   HENT:  Negative for ear pain, sinus pain and sore throat.    Eyes:  Negative for pain.   Respiratory:  Negative for cough and shortness of breath.    Cardiovascular:  Negative for chest pain and palpitations.   Gastrointestinal:  Negative for abdominal pain, nausea, rectal  pain and vomiting.   Genitourinary:  Positive for difficulty urinating and flank pain. Negative for dysuria and hematuria.   Musculoskeletal:  Negative for back pain and neck pain.   Skin:  Negative for rash.   Neurological:  Negative for seizures and headaches.       Physical Exam   Triage vitals signs - Temp: 97.3 F (36.3 C), Heart Rate: 64, Resp: 26, BP: (!) 232/102, SpO2: 100 %    Physical Exam  Vitals and nursing note reviewed.   Constitutional:       General: He is not in acute distress.     Appearance: Normal appearance. He is not ill-appearing.   HENT:      Head: Normocephalic and atraumatic.      Right Ear: External ear normal.      Left Ear: External ear normal.      Nose: Nose normal.      Mouth/Throat:      Mouth: Mucous membranes are moist.      Pharynx: Oropharynx is clear.   Eyes:      Extraocular Movements: Extraocular movements intact.      Conjunctiva/sclera: Conjunctivae normal.      Pupils: Pupils are equal, round, and reactive to light.   Cardiovascular:      Rate and Rhythm: Normal rate and regular rhythm.      Pulses: Normal pulses.      Heart sounds: Normal heart sounds.    Pulmonary:      Effort: Pulmonary effort is normal. No respiratory distress.      Breath sounds: Normal breath sounds.   Abdominal:      General: Abdomen is flat.      Palpations: Abdomen is soft.   Musculoskeletal:         General: Normal range of motion.      Cervical back: Normal range of motion.      Right lower leg: No edema.      Left lower leg: No edema.      Comments: Right flank pain    Skin:     General: Skin is warm and dry.   Neurological:      General: No focal deficit present.      Mental Status: He is alert. Mental status is at baseline.         ED Course     ED Lab Results       Procedure Component Value Units Date/Time    Urinalysis w/micro if Indicated, w/culture if Indicated [[id number]]  (Abnormal) Collected: 12/03/23 1349    Order Status: Completed Specimen: CCMS Urine Updated: 12/03/23 1427     Glucose Urine NEGATIVE mg/dL      Ketones, UA NEGATIVE mg/dL      Specific Gravity Ur 1.019     Occult Blood Urine NEGATIVE     pH, UA 5.5     Protein, UA TRACE* mg/dL      U Nitrites NEGATIVE     Leukocytes, UA NEGATIVE     Color Urine YELLOW     APPEARANCE URINE CLEAR    Narrative:      Testing by [employer] [address] [place], [place] [address] by [facility] [address] [place], [place] [address]    Urinalysis Microscopic [[id number]]  (Abnormal) Collected: 12/03/23 1349    Order Status: Completed Updated: 12/03/23 1427     WBC, UA 0-5  /HPF      Epi Cell-Ur 0-5  /HPF  RBC, UA 0-3  /HPF      Casts,Hyaline-Ur 0-5*  /LPF      Urine Comment Micro --                                          This urine was examined microscopically  for the presence of WBCs, RBCs, Epithelial  cells, bacteria, casts, and other formed  elements. Only those elements seen were reported.  ZTF = 0-5 and ZTT = 0-3      Narrative:      Testing by [employer] [address] [place], [place] [address] by [facility] [address] [place], [place] [address]    Basic  metabolic panel [[id number]]  (Abnormal) Collected: 12/03/23 1136    Order Status: Completed Specimen: Not Stated Updated: 12/03/23 1158     Sodium 139 mmol/L      Potassium 4.2 mmol/L      Chloride 107 mmol/L      CO2 25 mmol/L      Glucose 109* mg/dL      Glucose --* mg/dL                                           If result of random glucose > or = 200   or if result of fasting glucose is > 125   confirm Diabetes Mellitus diagnosis with   second glucose on a different day.  *     BUN 13 mg/dL      Creatinine 8.90 mg/dL      eGFR 77 fO/fpw/8.26 M2      EGFR Comment -- mL/min/1.73 M2      Either of the following must be  present for >=3 months to be Chronic  Kidney Disease:     -GFR less than 60 for >=3 months     -Albumin to Creatinine Ratio >=30 mg/g      or other markers of kidney damage                                       An estimated GFR chronically in the   range of 60-89 is categorized as   mildly decreased, which corresponds   to Stage G2 CKD.                                       CKD-EPI equation (2021) used to  estimate GFR       Calcium 9.2 mg/dL      Anion Gap 7 mmol/L     Narrative:      Testing by [employer] [address] [place], [place] [address] by [facility] [address] [place], [place] [address]    LIPASE [[id number]] Collected: 12/03/23 1136    Order Status: Completed Specimen: Not Stated Updated: 12/03/23 1157     Lipase 73 U/L     Narrative:      Testing by [employer] [address] [place], [place] [address] by [facility] [address] [place], [place] [address]    Hepatic function panel [  [id number]] Collected: 12/03/23 1136    Order Status: Completed Specimen: Not Stated Updated: 12/03/23 1157     AST 19 U/L      ALT 20 U/L      Alkaline Phosphatase 78 U/L      Bilirubin, Total 0.8 mg/dL      Bilirubin, Direct 0.3 mg/dL      Albumin Blood 4.2 g/dL      Protein, Total 7.1 g/dL     Narrative:      Testing by [employer] [address] [place], [place] [address] by [facility] [address] [place], [place] [address]    CBC and differential [[id number]]  (Abnormal) Collected: 12/03/23 1136    Order Status: Completed Specimen: Not Stated Updated: 12/03/23 1149     WBC 6.0 K/CUMM      RBC 4.58 M/CUMM      Hemoglobin 14.3 g/dL      Hematocrit 57.6 %      MCV 92.4 fL      MCH 31.2 pg      MCHC 33.8 g/dL      RDW 86.5 %      Platelets 128* K/CUMM      MPV 11.1 fL      Segs Relative 62 %      Lymphocytes 28 %      Monocyte 6 %      Eosinophils 3 %      Basophils 1 %      Absolute Neutrophils 3.72 K/CUMM      ABSOLUTE LYMPHOCYTES 1.68 K/CUMM      Absolute Monocytes 0.36 K/CUMM      ABSOLUTE EOSINOPHILS 0.18 K/CUMM      ABSOLUTE BASOPHILS 0.06 K/CUMM      Diff Method Electronic WBC differential count    Narrative:      Testing by [employer] [address] [place], [place] [address] by [facility] [address] [place], [place] [address]    POC CREATININE WB WITH GFR (NON ORDERABLE) [[id number]] Collected: 12/03/23 1132    Order Status: Completed Updated: 12/03/23 1136     POC Creatinine 1.3 MG/DL      eGFR 63 fO/fpw/8.26 M2      EGFR Comment -- mL/min/1.73 M2                                           An estimated GFR chronically in the   range of 60-89 is categorized as   mildly decreased, which corresponds   to Stage G2 CKD.                                       CKD-EPI equation (2021) used to  estimate GFR      Narrative:      Testing by [facility] [address] [place], [place] [address] by [facility] [address] [place], [place] [address]    Lipase [[id number]] Collected: 12/03/23 1136    Order Status: Canceled Specimen: Blood             Radiology Results       Procedure Component Value Units Date/Time    CT abdomen pelvis without IV contrast [[id number]] Collected: 12/03/23 1259  Order Status: Completed Updated: 12/03/23 1305    Narrative:      EXAM:  CT  ABDOMEN AND PELVIS WITHOUT CONTRAST    INDICATION:  Right flank pain and dysuria, left nephrectomy, hx of previous pyelo and stones    Tech Comments: No additional history    TECHNIQUE:  Low dose, multichannel computerized tomography of the abdomen and pelvis was performed without IV contrast.  Multiplanar reformats were reviewed.    Evaluation of the solid organs is limited without IV contrast.    COMPARISON:  CT November 02, 2022    FINDINGS:  LOWER CHEST: Lung bases are clear.  No acute findings.    LIVER: Normal morphology.    BILIARY: Unremarkable.    PANCREAS: No evidence of mass or inflammation.    SPLEEN: Unremarkable.    ADRENALS AND KIDNEYS: Adrenal glands are normal.  Status post left nephrectomy.  Right renal cyst.  No nephrolithiasis or hydronephrosis.    GASTROINTESTINAL: No evidence of abnormal bowel wall thickening or obstruction. Colonic diverticula without evidence of diverticulitis.  Normal appendix.    VASCULAR: Abdominal aorta is normal in caliber. There is atherosclerotic disease.    LYMPH NODES: No pathologically enlarged lymph nodes.    PERITONEUM: No free air or ascites.    PELVIC ORGANS AND BLADDER: Unremarkable.    BODY WALL AND SOFT TISSUES: Fat containing umbilical hernia.    BONES: No acute or suspicious abnormality.    IMPRESSION:  No acute abnormality of the abdomen or pelvis.    Electronically Signed  By: Casandra Lombard M.D.  On: 12/03/2023 1:04:16 PM  On: [facility]      CT abdomen pelvis with IV contrast - In process [[id number]] Updated: 12/03/23 1227    Order Status: Canceled             ED Medication Administration from 12/03/2023 1106 to 12/07/2023 1916         Date/Time Order Dose Route Action Action by     12/03/2023 1142 EDT HYDROmorphone  (DILAUDID ) 1 mg/mL injection 1 mg 1 mg Intravenous Given Rubin Redhead, LPN     92/86/7974 1142 EDT ondansetron  (PF) (ZOFRAN ) injection 4 mg 4 mg Intravenous Given Rubin Redhead, LPN     92/86/7974 1309 EDT sodium chloride  0.9 % bolus  500 mL 0 mL Intravenous Stopped Elfredia Rollo LABOR, RN     12/03/2023 1147 EDT sodium chloride  0.9 % bolus 500 mL 500 mL Intravenous [address] Rubin Redhead, LPN     92/86/7974 1345 EDT HYDROmorphone  (DILAUDID ) 1 mg/mL injection 1 mg 1 mg Intravenous Given Majchrowicz, Fairy, RN            ED Vitals      Date and Time Temp Pulse Resp BP SpO2 By   12/03/23 1440 -- 58 -- -- 96 % KAS   12/03/23 1429 -- 59 -- -- 97 % KAS   12/03/23 1416 -- 59 -- -- 95 % KAS   12/03/23 1340 -- 57 -- 181/99* 96 % AP   12/03/23 1159 -- 61 -- 234/100* 96 % KAS             Scoring Tools  Procedures   Procedures    MDM   Medical Decision Making  Amount and/or Complexity of Data Reviewed  Labs: ordered.  Radiology: ordered. Decision-making details documented in ED Course.      ED Course as of 12/07/23 1916   Marrie Juliene Flores Documentation   Sun Dec 03, 2023   1328 CT abdomen pelvis without IV contrast  IMPRESSION:  No acute abnormality of the abdomen or pelvis.        Patient presented with reports of right flank/back pain reports history of kidney stones previously also has history of chronic back pain here without signs of infection CT without signs of obstructing stone  Blood work without gross abnormality doing well and reevaluation and feeling some improvement  Discharge home with follow-up and return precautions given    Clinical Impression     1. Chronic right-sided low back pain without sciatica        Disposition     Discharge Medication List as of 12/03/2023  2:32 PM          ED Disposition       ED Disposition   Discharge    Date/Time   Sun Dec 03, 2023  2:32 PM    Comment   --               The patient was instructed to follow up with  No follow-up provider specified.             ED Scribe: Danita Acron  Scribing for Dr. Marrie  December 07, 2023    I have reviewed the documentation provided by the ED scribe in this document and I agree with its  contents.  Juliene Garnette Marrie Marrie Juliene Garnette, MD  12/07/23 1917         [1]  Allergies  Allergen Reactions   . Codeine Anxiety   . Nsaids (Non-Steroidal Anti-Inflammatory Drug) Other (See Comments)     Kidney removal    . Toradol [Ketorolac] Other (See Comments)     Due to renal impairment   *Some images could not be shown.

## 2023-12-19 ENCOUNTER — Inpatient Hospital Stay
Admit: 2023-12-19 | Discharge: 2023-12-20 | Disposition: A | Discharge status: Home / Self Care | Arrived: WI | Attending: Student in an Organized Health Care Education/Training Program

## 2023-12-19 ENCOUNTER — Emergency Department: Admit: 2023-12-20

## 2023-12-19 DIAGNOSIS — N179 Acute kidney failure, unspecified: Principal | ICD-10-CM

## 2023-12-19 NOTE — ED Provider Notes (Signed)
 [facility] EMERGENCY DEPARTMENT  eMERGENCY dEPARTMENT eNCOUnter      Pt Name: Christopher Peters  MRN: [id number]  Birthdate [birth year]  Date of evaluation: 12/19/2023  Provider: Harlene Gavel, MD    CHIEF COMPLAINT       Chief Complaint   Patient presents with    Flank Pain     Concerned for kidney stone, pt only has the right kidney had to have left kidney removed due to multiple kidney stones          HISTORY OF PRESENT ILLNESS   (Location/Symptom, Timing/Onset,Context/Setting, Quality, Duration, Modifying Factors, Severity)  Note limiting factors.     HPI    Christopher Peters is a 63 y.o. male with PMH of hypertension, smoking, kidney stones, left nephrectomy who presents to the emergency department with CC of right flank pain ongoing for the last 3 weeks, feels similar to his prior kidney stones, and has had dark concentrated urine.  He reports that 8 years ago he had a left nephrectomy because his left kidney was filled with 30+ stones.  This was done in [place]  where the patient is from, he is currently traveling through our area.  He reports intermittent nausea, no vomiting.  He denies fevers or dysuria.        NursingNotes were reviewed.    REVIEW OF SYSTEMS    (2-9 systems for level 4, 10 or more for level 5)     Review of Systems   Constitutional:  Negative for appetite change, chills, fatigue and fever.   HENT:  Negative for congestion and sore throat.    Eyes:  Negative for pain and redness.   Respiratory:  Negative for cough, chest tightness and shortness of breath.    Cardiovascular:  Negative for chest pain and leg swelling.   Gastrointestinal:  Positive for nausea. Negative for abdominal pain, blood in stool, diarrhea and vomiting.   Genitourinary:  Positive for decreased urine volume and flank pain. Negative for dysuria and frequency.   Musculoskeletal:  Negative for neck pain and neck stiffness.   Skin:  Negative for rash and wound.   Neurological:  Negative for dizziness, seizures,  light-headedness and headaches.   Psychiatric/Behavioral:  Negative for behavioral problems and confusion.             PAST MEDICALHISTORY     Past Medical History:   Diagnosis Date    Hypertension     Hypertension     Kidney stone     Kidney stones          SURGICAL HISTORY       Past Surgical History:   Procedure Laterality Date    FRACTURE SURGERY      KIDNEY REMOVAL Left          CURRENT MEDICATIONS     Discharge Medication List as of 12/19/2023 10:40 PM        CONTINUE these medications which have NOT CHANGED    Details   acetaminophen  (TYLENOL ) 500 MG tablet Take 2 tablets by mouth every 6 hours as needed for Pain, Disp-40 tablet, R-0Normal      albuterol  sulfate HFA (VENTOLIN  HFA) 108 (90 Base) MCG/ACT inhaler Inhale 2 puffs into the lungs 4 times daily as needed for Wheezing, Disp-18 g, R-0Normal      nicotine  (NICODERM CQ ) 14 MG/24HR Place 1 patch onto the skin daily for 14 days, Disp-14 patch, R-0Normal      atenolol (TENORMIN) 50 MG tablet Take 50 mg  by mouth dailyHistorical Med      Cholecalciferol (VITAMIN D3) 2000 units CAPS Take by mouthHistorical Med      promethazine  (PHENERGAN ) 25 MG tablet Take 1 tablet by mouth every 6 hours as needed for Nausea, Disp-15 tablet, R-0Print             ALLERGIES     Acetaminophen -codeine and Codeine    FAMILY HISTORY     History reviewed. No pertinent family history.       SOCIAL HISTORY       Social History     Socioeconomic History    Marital status: Legally Separated     Spouse name: None    Number of children: None    Years of education: None    Highest education level: None   Tobacco Use    Smoking status: Every Day     Current packs/day: 1.00     Types: Cigarettes    Smokeless tobacco: Never   Substance and Sexual Activity    Alcohol use: Not Currently    Drug use: Never     Social Drivers of Health      Received from [place] and [employer]    Received from [employer] and [employer]     Received from [employer] ([place], [place], [place], [place])    Family and [employer]   Intimate Partner Violence: Not At Risk (10/05/2023)    Received from [employer]    Abuse Screen     Feels Unsafe at Home or Work/School: no     Feels Threatened by Someone: no     Does Anyone Try to Keep You From Having Contact with Others or Doing Things Outside Your Home?: no     Physical Signs of Abuse Present: no    Received from [employer] and [employer]    Housing/Utilities       SCREENINGS    Glasgow Coma Scale  Eye Opening: Spontaneous  Best Verbal Response: Oriented  Best Motor Response: Obeys commands  Glasgow Coma Scale Score: 15        PHYSICAL EXAM    (up to 7 for level 4, 8 or more for level 5)     ED Triage Vitals [12/19/23 1700]   BP Systolic BP Percentile Diastolic BP Percentile Temp Temp src Pulse Respirations SpO2   (!) 144/71 -- -- 98.7 F (37.1 C) -- 67 18 96 %      Height Weight - Scale         -- 125 kg (275 lb 9.2 oz)             Physical Exam  Vitals and nursing note reviewed.   Constitutional:       General: He is not in acute distress.     Appearance: He is not ill-appearing.   HENT:      Head: Normocephalic and atraumatic.      Right Ear: External ear normal.      Left Ear: External ear normal.      Nose: Nose normal.      Mouth/Throat:      Mouth: Mucous membranes are moist.      Pharynx: Oropharynx is clear.   Eyes:      General: No scleral icterus.  Cardiovascular:      Rate and Rhythm: Normal rate and regular rhythm.      Pulses: Normal pulses.  Heart sounds: No murmur heard.  Pulmonary:      Effort: Pulmonary effort is normal. No respiratory distress.      Breath sounds: No wheezing, rhonchi or rales.   Abdominal:      General: There is no distension.      Palpations: Abdomen is soft.      Tenderness: There is no abdominal tenderness. There is right CVA tenderness.   Musculoskeletal:      Cervical back: Normal range of motion and neck supple.      Right lower leg: No  edema.      Left lower leg: No edema.   Skin:     General: Skin is warm and dry.      Capillary Refill: Capillary refill takes less than 2 seconds.      Coloration: Skin is not jaundiced.   Neurological:      Mental Status: He is alert and oriented to person, place, and time. Mental status is at baseline.   Psychiatric:         Mood and Affect: Mood normal.         DIAGNOSTIC RESULTS     EKG: All EKG's areinterpreted by the Emergency Department Physician who either signs or Co-signs this chart in the absence of a cardiologist.    not obtained    RADIOLOGY:  Non-plain film images such as CT, Ultrasound and MRI are read by the radiologist. Plain radiographic images are visualized and preliminarily interpreted bythe emergency physician with the below findings:      CT KIDNEY WO CONTRAST   Final Result   No evidence of urolithiasis.       No acute intra-abdominal findings.       Status post left nephrectomy.       Diverticulosis without evidence of diverticulitis.       Numerous basilar micronodules could be secondary to small airway infection/inflammation however follow-up CT in 1 month is recommended to exclude other etiology.       Other incidental findings as described above.        All CT scans are performed using dose optimization techniques as appropriate to the performed exam and include    at least one of the following: Automated exposure control, adjustment of the mA and/or kV according to size, and the use of iterative reconstruction technique.        ______________________________________    Electronically signed by: REYES LATHER M.D.   Date:     12/19/2023   Time:    21:26               LABS:  Labs Reviewed   CBC WITH AUTO DIFFERENTIAL - Abnormal; Notable for the following components:       Result Value    MCH 31.3 (*)     All other components within normal limits   COMPREHENSIVE METABOLIC PANEL - Abnormal; Notable for the following components:    Glucose 105 (*)     Creatinine 1.6 (*)     Est, Glom Filt  Rate 48 (*)     All other components within normal limits   LIPASE   URINALYSIS WITH REFLEX TO CULTURE       All other labs were within normal range or not returned as of this dictation.    EMERGENCY DEPARTMENT COURSE and DIFFERENTIAL DIAGNOSIS/MDM:   Vitals:    Vitals:    12/19/23 1700 12/19/23 2030   BP: (!) 144/71    Pulse: 67 64  Resp: 18 16   Temp: 98.7 F (37.1 C)    SpO2: 96% 95%   Weight: 125 kg (275 lb 9.2 oz)        MDM      Reassessment    Patient is a 63 y.o. male presenting with right flank pain        ED Course as of 12/19/23 2259   Tue Dec 19, 2023   2027 Patient presents with right flank pain, prior left nephrectomy due to recurrent kidney stones.  Patient concerned about having another kidney stone.  Basic labs CBC unremarkable, CMP with creatinine elevated at 1.6.  Lipase is normal.  Given fluids, morphine , Zofran .  Plan to obtain urinalysis as well as CT Noncon right kidney to evaluate for stones. [JG]   2258 Given fluids for AKI.  CT negative for any evidence of renal stones.  Pain likely musculoskeletal in nature in setting of otherwise reassuring workup, urinalysis without infection.  Given Norflex .  States he has muscle relaxers, Tylenol , and lidocaine patches at home.  Patient stable for discharge home [JG]      ED Course User Index  [JG] Yvone Raisin, MD                CONSULTS:  None    :  Unless otherwise noted below, none     Procedures    FINAL IMPRESSION      1. Flank pain          DISPOSITION/PLAN   DISPOSITION Decision To Discharge 12/19/2023 10:37:54 PM   DISPOSITION CONDITION Stable           PATIENT REFERRED TO:  No follow-up provider specified.    DISCHARGE MEDICATIONS:  Discharge Medication List as of 12/19/2023 10:40 PM             (Please note that portions of this note were completed with a voice recognition program.  Efforts were made to edit thedictations but occasionally words are mis-transcribed.)    Raisin Yvone, MD (electronically signed)Emergency Physician           Yvone Raisin, MD  12/19/23 2259

## 2023-12-20 LAB — COMPREHENSIVE METABOLIC PANEL
ALT: 20 U/L (ref 10–50)
AST: 19 U/L (ref 10–50)
Albumin: 4.3 g/dL (ref 3.5–5.2)
Alkaline Phosphatase: 84 U/L (ref 40–129)
Anion Gap: 12 mmol/L (ref 8–16)
BUN: 23 mg/dL (ref 8–23)
CO2: 25 mmol/L (ref 22–29)
Calcium: 9.6 mg/dL (ref 8.8–10.2)
Chloride: 104 mmol/L (ref 98–107)
Creatinine: 1.6 mg/dL — ABNORMAL HIGH (ref 0.7–1.2)
Est, Glom Filt Rate: 48 — AB (ref >60)
Glucose: 105 mg/dL — ABNORMAL HIGH (ref 70–99)
Potassium: 4.5 mmol/L (ref 3.5–5.1)
Sodium: 141 mmol/L (ref 136–145)
Total Bilirubin: 0.6 mg/dL (ref 0.2–1.2)
Total Protein: 7.1 g/dL (ref 6.4–8.3)

## 2023-12-20 LAB — URINALYSIS WITH REFLEX TO CULTURE
Bilirubin, Urine: NEGATIVE
Blood, Urine: NEGATIVE
Glucose, Ur: NEGATIVE mg/dL
Ketones, Urine: NEGATIVE mg/dL
Leukocyte Esterase, Urine: NEGATIVE
Nitrite, Urine: NEGATIVE
Protein, UA: NEGATIVE mg/dL
Specific Gravity, UA: 1.025 (ref 1.005–1.030)
Urobilinogen, Urine: 1 U/dL (ref <2.0)
pH, Urine: 5.5 (ref 5.0–8.0)

## 2023-12-20 LAB — CBC WITH AUTO DIFFERENTIAL
Basophils %: 0.5 % (ref 0.0–1.0)
Basophils Absolute: 0 K/uL (ref 0.00–0.20)
Eosinophils %: 3.7 % (ref 0.0–5.0)
Eosinophils Absolute: 0.2 K/uL (ref 0.00–0.60)
Hematocrit: 44.9 % (ref 42.0–52.0)
Hemoglobin: 15 g/dL (ref 14.0–18.0)
Immature Granulocytes #: 0 K/uL
Lymphocytes %: 30.8 % (ref 20.0–40.0)
Lymphocytes Absolute: 2 K/uL (ref 1.1–4.5)
MCH: 31.3 pg — ABNORMAL HIGH (ref 27.0–31.0)
MCHC: 33.4 g/dL (ref 33.0–37.0)
MCV: 93.7 fL (ref 80.0–94.0)
MPV: 10.7 fL (ref 9.4–12.4)
Monocytes %: 8.2 % (ref 0.0–10.0)
Monocytes Absolute: 0.5 K/uL (ref 0.00–0.90)
Neutrophils %: 56.3 % (ref 50.0–65.0)
Neutrophils Absolute: 3.7 K/uL (ref 1.5–7.5)
Platelets: 136 K/uL (ref 130–400)
RBC: 4.79 M/uL (ref 4.70–6.10)
RDW: 13.3 % (ref 11.5–14.5)
WBC: 6.5 K/uL (ref 4.8–10.8)

## 2023-12-20 LAB — LIPASE: Lipase: 46 U/L (ref 13–60)

## 2023-12-20 MED ORDER — NICOTINE 21 MG/24HR TD PT24
21 | Freq: Every day | TRANSDERMAL | Status: DC
Start: 2023-12-20 — End: 2023-12-19

## 2023-12-20 MED ORDER — ORPHENADRINE CITRATE ER 100 MG PO TB12
100 | Freq: Once | ORAL | Status: AC
Start: 2023-12-20 — End: 2023-12-19
  Administered 2023-12-20: 02:00:00 100 mg via ORAL

## 2023-12-20 MED ORDER — MORPHINE SULFATE (PF) 4 MG/ML IJ SOLN
4 | Freq: Once | INTRAMUSCULAR | Status: AC
Start: 2023-12-20 — End: 2023-12-19
  Administered 2023-12-20: 4 mg via INTRAVENOUS

## 2023-12-20 MED ORDER — SODIUM CHLORIDE 0.9 % IV BOLUS
0.9 | Freq: Once | INTRAVENOUS | Status: AC
Start: 2023-12-20 — End: 2023-12-19
  Administered 2023-12-20: 1000 mL via INTRAVENOUS

## 2023-12-20 MED ORDER — ONDANSETRON HCL 4 MG/2ML IJ SOLN
4 | Freq: Once | INTRAMUSCULAR | Status: AC
Start: 2023-12-20 — End: 2023-12-19
  Administered 2023-12-20: 4 mg via INTRAVENOUS

## 2023-12-20 MED ORDER — NICOTINE 21 MG/24HR TD PT24
21 | TRANSDERMAL | Status: DC
Start: 2023-12-20 — End: 2023-12-20
  Administered 2023-12-20: 02:00:00 1 via TRANSDERMAL

## 2023-12-20 MED FILL — ORPHENADRINE CITRATE ER 100 MG PO TB12: 100 mg | ORAL | Qty: 1 | Fill #0

## 2023-12-20 MED FILL — MORPHINE SULFATE 4 MG/ML IJ SOLN: 4 mg/mL | INTRAMUSCULAR | Qty: 1 | Fill #0

## 2023-12-20 MED FILL — ONDANSETRON HCL 4 MG/2ML IJ SOLN: 4 MG/2ML | INTRAMUSCULAR | Qty: 2 | Fill #0

## 2023-12-20 MED FILL — NICOTINE 21 MG/24HR TD PT24: 21 mg/(24.h) | TRANSDERMAL | Qty: 1 | Fill #0

## 2024-04-07 ENCOUNTER — Other Ambulatory Visit: Payer: Self-pay

## 2024-04-07 ENCOUNTER — Emergency Department
Admission: EM | Admit: 2024-04-07 | Discharge: 2024-04-07 | Disposition: A | Discharge status: Home / Self Care | Payer: Self-pay | Attending: Emergency Medicine | Admitting: Emergency Medicine

## 2024-04-07 ENCOUNTER — Emergency Department: Payer: Self-pay

## 2024-04-07 DIAGNOSIS — R300 Dysuria: Secondary | ICD-10-CM | POA: Insufficient documentation

## 2024-04-07 DIAGNOSIS — M545 Low back pain, unspecified: Secondary | ICD-10-CM | POA: Insufficient documentation

## 2024-04-07 DIAGNOSIS — G8929 Other chronic pain: Secondary | ICD-10-CM | POA: Insufficient documentation

## 2024-04-07 LAB — URINALYSIS, ROUTINE W REFLEX MICROSCOPIC
Bilirubin Urine: NEGATIVE
Glucose, UA: NEGATIVE mg/dL
Hgb urine dipstick: NEGATIVE
Ketones, ur: NEGATIVE mg/dL
Leukocytes,Ua: NEGATIVE
Nitrite: NEGATIVE
Protein, ur: NEGATIVE mg/dL
Specific Gravity, Urine: 1.019 (ref 1.005–1.030)
pH: 5 (ref 5.0–8.0)

## 2024-04-07 LAB — CBC
HCT: 43.9 % (ref 39.0–52.0)
Hemoglobin: 15 g/dL (ref 13.0–17.0)
MCH: 31.3 pg (ref 26.0–34.0)
MCHC: 34.2 g/dL (ref 30.0–36.0)
MCV: 91.6 fL (ref 80.0–100.0)
Platelets: 120 K/uL — ABNORMAL LOW (ref 150–400)
RBC: 4.79 MIL/uL (ref 4.22–5.81)
RDW: 13.2 % (ref 11.5–15.5)
WBC: 7.1 K/uL (ref 4.0–10.5)
nRBC: 0 % (ref 0.0–0.2)

## 2024-04-07 LAB — BASIC METABOLIC PANEL WITH GFR
Anion gap: 11 (ref 5–15)
BUN: 16 mg/dL (ref 8–23)
CO2: 22 mmol/L (ref 22–32)
Calcium: 9.1 mg/dL (ref 8.9–10.3)
Chloride: 107 mmol/L (ref 98–111)
Creatinine, Ser: 1.27 mg/dL — ABNORMAL HIGH (ref 0.61–1.24)
GFR, Estimated: >60 mL/min (ref >60)
Glucose, Bld: 161 mg/dL — ABNORMAL HIGH (ref 70–99)
Potassium: 4.6 mmol/L (ref 3.5–5.1)
Sodium: 140 mmol/L (ref 135–145)

## 2024-04-07 MED ORDER — MORPHINE SULFATE (PF) 4 MG/ML IV SOLN
4.0000 mg | Freq: Once | INTRAVENOUS | Status: AC
  Administered 2024-04-07: 4 mg via INTRAVENOUS
  Filled 2024-04-07: qty 1

## 2024-04-07 MED ORDER — LIDOCAINE 5 % EX PTCH
1.0000 | MEDICATED_PATCH | CUTANEOUS | Status: DC
  Administered 2024-04-07: 1 via TRANSDERMAL
  Filled 2024-04-07: qty 1

## 2024-04-07 NOTE — ED Notes (Signed)
 See triage note  Presents with some lower back pain  States pain is on the right side  non radiating  But states he is having some burning with urination

## 2024-04-07 NOTE — ED Triage Notes (Signed)
 Pt to ED for lower back pain that radiates to R side but not to leg since 2 days. States hx renal stones and has only R kidney. States also having dysuria.

## 2024-04-07 NOTE — Discharge Instructions (Signed)
 Your blood work, urinalysis, and CT scan are reassuring.  Please follow-up with your outpatient provider.  Please return for any new, worsening, or changing symptoms or other concerns.  It was a pleasure caring for you today.

## 2024-04-07 NOTE — ED Provider Notes (Signed)
 [facility] Provider Note    Event Date/Time   First MD Initiated Contact with Patient 04/07/24 1018     (approximate)   History   Back Pain   HPI  Christopher Archer is a 63 y.o. male with a past medical history of kidney stones, chronic back pain, and obesity who presents today with right flank pain.  Patient reports that this pain feels similar to his previous kidney stones.  He reports that he also has burning with urination.  No nausea or vomiting.  No fevers or chills.  Denies chest pain or shortness of breath.  No injuries.  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  There are no active problems to display for this patient.         Physical Exam   Triage Vital Signs: ED Triage Vitals  Encounter Vitals Group     BP 04/07/24 1008 (!) 144/115     Girls Systolic BP Percentile --      Girls Diastolic BP Percentile --      Boys Systolic BP Percentile --      Boys Diastolic BP Percentile --      Pulse Rate 04/07/24 1008 73     Resp 04/07/24 1008 20     Temp 04/07/24 1008 98.7 F (37.1 C)     Temp Source 04/07/24 1008 Oral     SpO2 04/07/24 1008 97 %     Weight 04/07/24 1006 275 lb (124.7 kg)     Height 04/07/24 1006 5' 9 (1.753 m)     Head Circumference --      Peak Flow --      Pain Score 04/07/24 1006 8     Pain Loc --      Pain Education --      Exclude from Growth Chart --     Most recent vital signs: Vitals:   04/07/24 1008  BP: (!) 144/115  Pulse: 73  Resp: 20  Temp: 98.7 F (37.1 C)  SpO2: 97%    Physical Exam Vitals and nursing note reviewed.  Constitutional:      General: Awake and alert. No acute distress.    Appearance: Normal appearance.  HENT:     Head: Normocephalic and atraumatic.     Mouth: Mucous membranes are moist.  Eyes:     General: PERRL. Normal EOMs        Right eye: No discharge.        Left eye: No discharge.     Conjunctiva/sclera: Conjunctivae normal.   Cardiovascular:     Rate and Rhythm: Normal rate.     Pulses: Normal pulses.  Pulmonary:     Effort: Pulmonary effort is normal. No respiratory distress.     Breath sounds: Normal breath sounds.  Abdominal:     Abdomen is soft. There is no abdominal tenderness. No rebound or guarding. No distention.  No CVA tenderness Musculoskeletal:        General: No swelling. Normal range of motion.     Cervical back: Normal range of motion and neck supple.  Back: No midline tenderness.  Tenderness palpation to right lumbar paraspinal muscle area.  No overlying skin changes noted.  Strength and sensation 5/5 to bilateral lower extremities. Normal great toe extension against resistance. Normal sensation throughout feet. Normal patellar reflexes. Negative SLR and opposite SLR bilaterally. Negative FABER test Skin:    General: Skin is warm and  dry.     Capillary Refill: Capillary refill takes less than 2 seconds.     Findings: No rash.  Neurological:     Mental Status: The patient is awake and alert.      ED Results / Procedures / Treatments   Labs (all labs ordered are listed, but only abnormal results are displayed) Labs Reviewed  URINALYSIS, ROUTINE W REFLEX MICROSCOPIC - Abnormal; Notable for the following components:      Result Value   Color, Urine YELLOW (*)    APPearance CLEAR (*)    All other components within normal limits  BASIC METABOLIC PANEL WITH GFR - Abnormal; Notable for the following components:   Glucose, Bld 161 (*)    Creatinine, Ser 1.27 (*)    All other components within normal limits  CBC - Abnormal; Notable for the following components:   Platelets 120 (*)    All other components within normal limits     EKG     RADIOLOGY I independently reviewed and interpreted imaging and agree with radiologists findings.     PROCEDURES:  Critical Care performed:   Procedures   MEDICATIONS ORDERED IN ED: Medications  lidocaine  (LIDODERM ) 5 % 1 patch (1 patch  Transdermal Patch Applied 04/07/24 1118)  morphine  (PF) 4 MG/ML injection 4 mg (4 mg Intravenous Given 04/07/24 1118)     IMPRESSION / MDM / ASSESSMENT AND PLAN / ED COURSE  I reviewed the triage vital signs and the nursing notes.   Differential diagnosis includes, but is not limited to, kidney stone, exacerbation of chronic back pain, UTI/pyelonephritis.  No chest pain, upper back pain, shortness of breath, neurological symptoms.  Pulses are equal in all 4 extremities.  Do not suspect dissection.  I reviewed the patient's chart.  He has had multiple previous visits that outside emergency departments for the same complaint.  Further workup is indicated.  Labs obtained are overall reassuring.  Creatinine is at his baseline.  Urinalysis does not reveal evidence of urinary tract infection or blood.  CT is negative for any acute findings.  He has a benign-appearing renal cyst which we discussed.  We discussed and medicated him and return precautions.  On reevaluation he reports that he feels significantly improved and also relieved.  He agrees with plan for discharge.  Patient's presentation is most consistent with severe exacerbation of chronic illness.    FINAL CLINICAL IMPRESSION(S) / ED DIAGNOSES   Final diagnoses:  Chronic right-sided low back pain without sciatica     Rx / DC Orders   ED Discharge Orders     None        Note:  This document was prepared using Dragon voice recognition software and may include unintentional dictation errors.   Henlee Donovan E, PA-C 04/07/24 1316    Ernest Ronal BRAVO, MD 04/09/24 [phone]
